# Patient Record
Sex: Male | Born: 1965 | Race: White | Hispanic: Refuse to answer | Marital: Married | Smoking: Former smoker
Health system: Southern US, Community
[De-identification: ages and names within clinical notes are randomized; demographics above are authoritative.]

## PROBLEM LIST (undated history)

## (undated) DIAGNOSIS — F419 Anxiety disorder, unspecified: Secondary | ICD-10-CM

## (undated) DIAGNOSIS — M1990 Unspecified osteoarthritis, unspecified site: Secondary | ICD-10-CM

## (undated) DIAGNOSIS — E785 Hyperlipidemia, unspecified: Secondary | ICD-10-CM

## (undated) DIAGNOSIS — G709 Myoneural disorder, unspecified: Secondary | ICD-10-CM

## (undated) DIAGNOSIS — J45909 Unspecified asthma, uncomplicated: Secondary | ICD-10-CM

## (undated) DIAGNOSIS — M5030 Other cervical disc degeneration, unspecified cervical region: Secondary | ICD-10-CM

## (undated) DIAGNOSIS — F909 Attention-deficit hyperactivity disorder, unspecified type: Secondary | ICD-10-CM

## (undated) DIAGNOSIS — T7840XA Allergy, unspecified, initial encounter: Secondary | ICD-10-CM

## (undated) DIAGNOSIS — K219 Gastro-esophageal reflux disease without esophagitis: Secondary | ICD-10-CM

## (undated) DIAGNOSIS — E039 Hypothyroidism, unspecified: Secondary | ICD-10-CM

## (undated) DIAGNOSIS — G8929 Other chronic pain: Secondary | ICD-10-CM

## (undated) DIAGNOSIS — I10 Essential (primary) hypertension: Secondary | ICD-10-CM

## (undated) HISTORY — DX: Hyperlipidemia, unspecified: E78.5

## (undated) HISTORY — DX: Essential (primary) hypertension: I10

## (undated) HISTORY — DX: Attention-deficit hyperactivity disorder, unspecified type: F90.9

## (undated) HISTORY — DX: Hypothyroidism, unspecified: E03.9

## (undated) HISTORY — PX: SPINE SURGERY: SHX786

## (undated) HISTORY — PX: TONSILLECTOMY: SUR1361

## (undated) HISTORY — DX: Myoneural disorder, unspecified: G70.9

## (undated) HISTORY — PX: BREAST SURGERY: SHX581

## (undated) HISTORY — DX: Unspecified asthma, uncomplicated: J45.909

## (undated) HISTORY — DX: Unspecified osteoarthritis, unspecified site: M19.90

## (undated) HISTORY — DX: Gastro-esophageal reflux disease without esophagitis: K21.9

## (undated) HISTORY — DX: Anxiety disorder, unspecified: F41.9

## (undated) HISTORY — DX: Other cervical disc degeneration, unspecified cervical region: M50.30

## (undated) HISTORY — DX: Other chronic pain: G89.29

## (undated) HISTORY — DX: Allergy, unspecified, initial encounter: T78.40XA

---

## 1998-03-17 HISTORY — PX: SHOULDER SURGERY: SHX246

## 2002-05-12 ENCOUNTER — Encounter: Admission: RE | Admit: 2002-05-12 | Discharge: 2002-06-13 | Payer: Self-pay | Admitting: Family Medicine

## 2003-09-15 ENCOUNTER — Encounter: Admission: RE | Admit: 2003-09-15 | Discharge: 2003-09-15 | Payer: Self-pay | Admitting: Family Medicine

## 2003-09-27 ENCOUNTER — Encounter: Admission: RE | Admit: 2003-09-27 | Discharge: 2003-09-27 | Payer: Self-pay | Admitting: Family Medicine

## 2004-09-09 ENCOUNTER — Ambulatory Visit: Payer: Self-pay | Admitting: Family Medicine

## 2004-09-16 ENCOUNTER — Ambulatory Visit: Payer: Self-pay | Admitting: Family Medicine

## 2005-08-12 ENCOUNTER — Ambulatory Visit: Payer: Self-pay | Admitting: Family Medicine

## 2005-08-19 ENCOUNTER — Ambulatory Visit: Payer: Self-pay | Admitting: Family Medicine

## 2006-03-17 HISTORY — PX: SHOULDER SURGERY: SHX246

## 2006-08-12 ENCOUNTER — Ambulatory Visit: Payer: Self-pay | Admitting: Family Medicine

## 2006-08-12 LAB — CONVERTED CEMR LAB
AST: 26 units/L (ref 0–37)
Bilirubin, Direct: 0.1 mg/dL (ref 0.0–0.3)
Eosinophils Absolute: 0.1 10*3/uL (ref 0.0–0.6)
Eosinophils Relative: 1.5 % (ref 0.0–5.0)
GFR calc Af Amer: 96 mL/min
GFR calc non Af Amer: 80 mL/min
Glucose, Bld: 91 mg/dL (ref 70–99)
HCT: 42.2 % (ref 39.0–52.0)
Hemoglobin: 14.7 g/dL (ref 13.0–17.0)
Lymphocytes Relative: 34 % (ref 12.0–46.0)
MCV: 93.9 fL (ref 78.0–100.0)
Neutro Abs: 2.1 10*3/uL (ref 1.4–7.7)
Neutrophils Relative %: 54.7 % (ref 43.0–77.0)
Sodium: 139 meq/L (ref 135–145)
TSH: 4.02 microintl units/mL (ref 0.35–5.50)
Total Protein: 6.9 g/dL (ref 6.0–8.3)
WBC: 4 10*3/uL — ABNORMAL LOW (ref 4.5–10.5)

## 2006-08-18 ENCOUNTER — Ambulatory Visit: Payer: Self-pay | Admitting: Family Medicine

## 2007-01-08 ENCOUNTER — Telehealth: Payer: Self-pay | Admitting: Family Medicine

## 2007-01-12 ENCOUNTER — Telehealth: Payer: Self-pay | Admitting: Family Medicine

## 2007-01-15 ENCOUNTER — Ambulatory Visit: Payer: Self-pay | Admitting: Family Medicine

## 2007-07-16 HISTORY — PX: CERVICAL DISC SURGERY: SHX588

## 2007-07-23 ENCOUNTER — Ambulatory Visit: Payer: Self-pay | Admitting: Family Medicine

## 2007-07-23 DIAGNOSIS — M25519 Pain in unspecified shoulder: Secondary | ICD-10-CM

## 2007-07-23 DIAGNOSIS — E785 Hyperlipidemia, unspecified: Secondary | ICD-10-CM | POA: Insufficient documentation

## 2007-08-05 ENCOUNTER — Observation Stay (HOSPITAL_COMMUNITY): Admission: RE | Admit: 2007-08-05 | Discharge: 2007-08-06 | Payer: Self-pay | Admitting: Neurological Surgery

## 2007-11-19 ENCOUNTER — Ambulatory Visit: Payer: Self-pay | Admitting: Family Medicine

## 2007-11-19 LAB — CONVERTED CEMR LAB
ALT: 31 units/L (ref 0–53)
Albumin: 4.5 g/dL (ref 3.5–5.2)
BUN: 16 mg/dL (ref 6–23)
Basophils Relative: 0.5 % (ref 0.0–3.0)
Bilirubin Urine: NEGATIVE
CO2: 31 meq/L (ref 19–32)
Calcium: 9.6 mg/dL (ref 8.4–10.5)
Creatinine, Ser: 0.9 mg/dL (ref 0.4–1.5)
Eosinophils Relative: 2 % (ref 0.0–5.0)
GFR calc Af Amer: 121 mL/min
Glucose, Bld: 94 mg/dL (ref 70–99)
HCT: 41.9 % (ref 39.0–52.0)
Hemoglobin: 14.6 g/dL (ref 13.0–17.0)
Ketones, urine, test strip: NEGATIVE
Lymphocytes Relative: 35.1 % (ref 12.0–46.0)
Monocytes Absolute: 0.4 10*3/uL (ref 0.1–1.0)
Monocytes Relative: 9.7 % (ref 3.0–12.0)
Neutro Abs: 2.5 10*3/uL (ref 1.4–7.7)
Nitrite: NEGATIVE
RBC: 4.36 M/uL (ref 4.22–5.81)
RDW: 12.1 % (ref 11.5–14.6)
Sodium: 142 meq/L (ref 135–145)
Specific Gravity, Urine: 1.015
Total CHOL/HDL Ratio: 4.6
Total Protein: 7.3 g/dL (ref 6.0–8.3)
Triglycerides: 156 mg/dL — ABNORMAL HIGH (ref 0–149)

## 2007-12-03 ENCOUNTER — Ambulatory Visit: Payer: Self-pay | Admitting: Family Medicine

## 2008-04-06 ENCOUNTER — Telehealth: Payer: Self-pay | Admitting: Family Medicine

## 2008-04-19 ENCOUNTER — Telehealth: Payer: Self-pay | Admitting: *Deleted

## 2008-04-26 ENCOUNTER — Telehealth: Payer: Self-pay | Admitting: *Deleted

## 2008-09-22 ENCOUNTER — Ambulatory Visit: Payer: Self-pay | Admitting: Family Medicine

## 2008-09-22 LAB — CONVERTED CEMR LAB
ALT: 23 units/L (ref 0–53)
AST: 28 units/L (ref 0–37)
Albumin: 4.5 g/dL (ref 3.5–5.2)
BUN: 14 mg/dL (ref 6–23)
Chloride: 104 meq/L (ref 96–112)
Cholesterol: 176 mg/dL (ref 0–200)
Eosinophils Relative: 1.6 % (ref 0.0–5.0)
GFR calc non Af Amer: 87.6 mL/min (ref >60)
Glucose, Bld: 90 mg/dL (ref 70–99)
Glucose, Urine, Semiquant: NEGATIVE
HCT: 42.1 % (ref 39.0–52.0)
Hemoglobin: 14.9 g/dL (ref 13.0–17.0)
LDL Cholesterol: 115 mg/dL — ABNORMAL HIGH (ref 0–99)
Lymphs Abs: 2.6 10*3/uL (ref 0.7–4.0)
MCV: 94.7 fL (ref 78.0–100.0)
Monocytes Absolute: 0.5 10*3/uL (ref 0.1–1.0)
Monocytes Relative: 10.9 % (ref 3.0–12.0)
Neutro Abs: 1.8 10*3/uL (ref 1.4–7.7)
Platelets: 159 10*3/uL (ref 150.0–400.0)
Potassium: 3.9 meq/L (ref 3.5–5.1)
Protein, U semiquant: NEGATIVE
Sodium: 142 meq/L (ref 135–145)
TSH: 4 microintl units/mL (ref 0.35–5.50)
Total Protein: 7.6 g/dL (ref 6.0–8.3)
VLDL: 20.4 mg/dL (ref 0.0–40.0)
WBC Urine, dipstick: NEGATIVE
WBC: 5 10*3/uL (ref 4.5–10.5)
pH: 5.5

## 2008-09-29 ENCOUNTER — Ambulatory Visit: Payer: Self-pay | Admitting: Family Medicine

## 2009-03-01 ENCOUNTER — Encounter: Admission: RE | Admit: 2009-03-01 | Discharge: 2009-03-01 | Payer: Self-pay | Admitting: Neurological Surgery

## 2009-05-03 ENCOUNTER — Telehealth: Payer: Self-pay | Admitting: Family Medicine

## 2009-05-10 ENCOUNTER — Encounter: Payer: Self-pay | Admitting: Family Medicine

## 2009-07-24 ENCOUNTER — Ambulatory Visit: Payer: Self-pay | Admitting: Family Medicine

## 2009-07-24 DIAGNOSIS — J029 Acute pharyngitis, unspecified: Secondary | ICD-10-CM

## 2009-07-24 DIAGNOSIS — J309 Allergic rhinitis, unspecified: Secondary | ICD-10-CM | POA: Insufficient documentation

## 2009-09-10 ENCOUNTER — Ambulatory Visit: Payer: Self-pay | Admitting: Family Medicine

## 2009-09-10 LAB — CONVERTED CEMR LAB
ALT: 31 units/L (ref 0–53)
AST: 29 units/L (ref 0–37)
Albumin: 4.9 g/dL (ref 3.5–5.2)
Basophils Absolute: 0 10*3/uL (ref 0.0–0.1)
CO2: 32 meq/L (ref 19–32)
Chloride: 105 meq/L (ref 96–112)
Eosinophils Relative: 1.9 % (ref 0.0–5.0)
GFR calc non Af Amer: 92.5 mL/min (ref >60)
Glucose, Bld: 94 mg/dL (ref 70–99)
Glucose, Urine, Semiquant: NEGATIVE
HCT: 43.5 % (ref 39.0–52.0)
HDL: 46.3 mg/dL (ref >39.00)
Hemoglobin: 15.1 g/dL (ref 13.0–17.0)
Lymphs Abs: 1.9 10*3/uL (ref 0.7–4.0)
MCV: 96.1 fL (ref 78.0–100.0)
Monocytes Absolute: 0.6 10*3/uL (ref 0.1–1.0)
Monocytes Relative: 11.1 % (ref 3.0–12.0)
Neutro Abs: 3 10*3/uL (ref 1.4–7.7)
Nitrite: NEGATIVE
Potassium: 5.2 meq/L — ABNORMAL HIGH (ref 3.5–5.1)
RDW: 13.1 % (ref 11.5–14.6)
Sodium: 143 meq/L (ref 135–145)
Specific Gravity, Urine: >=1.03
TSH: 6.29 microintl units/mL — ABNORMAL HIGH (ref 0.35–5.50)
WBC Urine, dipstick: NEGATIVE
pH: 6

## 2009-09-28 ENCOUNTER — Ambulatory Visit: Payer: Self-pay | Admitting: Family Medicine

## 2009-11-08 ENCOUNTER — Telehealth: Payer: Self-pay | Admitting: Family Medicine

## 2009-12-28 ENCOUNTER — Encounter: Payer: Self-pay | Admitting: Family Medicine

## 2010-02-14 ENCOUNTER — Telehealth: Payer: Self-pay | Admitting: Family Medicine

## 2010-02-15 ENCOUNTER — Telehealth: Payer: Self-pay | Admitting: Internal Medicine

## 2010-02-28 ENCOUNTER — Ambulatory Visit (HOSPITAL_COMMUNITY)
Admission: RE | Admit: 2010-02-28 | Discharge: 2010-02-28 | Payer: Self-pay | Source: Home / Self Care | Attending: Neurological Surgery | Admitting: Neurological Surgery

## 2010-04-16 NOTE — Miscellaneous (Signed)
  Clinical Lists Changes  Medications: Added new medication of FLONASE 50 MCG/ACT SUSP (FLUTICASONE PROPIONATE) as directed - Signed Rx of FLONASE 50 MCG/ACT SUSP (FLUTICASONE PROPIONATE) as directed;  #1 x 4;  Signed;  Entered by: Lynann Beaver CMA;  Authorized by: Roderick Pee MD;  Method used: [facility]  [address] [phone]*, [address], [place], [place], [place]  [id number], Ph: [phone] or [phone], Fax: [phone]    Prescriptions: FLONASE 50 MCG/ACT SUSP (FLUTICASONE PROPIONATE) as directed  #1 x 4   Entered by:   Lynann Beaver CMA   Authorized by:   Roderick Pee MD   Signed by:   Lynann Beaver CMA on 12/28/2009   Method used:   Electronically to        [facility]  [address] [phone]* (retail)       [address]       [place], [place]  [address]       Ph: [phone] or [phone]       Fax: [phone]   RxID:   [id number]

## 2010-04-16 NOTE — Progress Notes (Signed)
Summary: rx   Phone Note Call from Patient   Summary of Call: would like a new rx for concerta 36 Initial call taken by: Kern Reap CMA Duncan Dull),  May 03, 2009 12:40 PM    New/Updated Medications: CONCERTA 36 MG CR-TABS (METHYLPHENIDATE HCL) take one tab once daily Prescriptions: CONCERTA 36 MG CR-TABS (METHYLPHENIDATE HCL) take one tab once daily  #90 x 0   Entered by:   Kern Reap CMA (AAMA)   Authorized by:   Roderick Pee MD   Signed by:   Kern Reap CMA (AAMA) on 05/03/2009   Method used:   Print then Give to Patient   RxID:   [id number]

## 2010-04-16 NOTE — Progress Notes (Signed)
Summary: refill  Phone Note Refill Request Message from:  Fax from Pharmacy on November 08, 2009 11:49 AM  Refills Requested: Medication #1:  CONCERTA 36 MG CR-TABS take one tab once daily. Initial call taken by: Kern Reap CMA Duncan Dull),  November 08, 2009 11:50 AM  Follow-up for Phone Call        left message on machine rx ready to pick up Follow-up by: Kern Reap CMA Duncan Dull),  November 08, 2009 12:12 PM    Prescriptions: CONCERTA 36 MG CR-TABS (METHYLPHENIDATE HCL) take one tab once daily  #90 x 0   Entered by:   Kern Reap CMA (AAMA)   Authorized by:   Roderick Pee MD   Signed by:   Kern Reap CMA (AAMA) on 11/08/2009   Method used:   Print then Give to Patient   RxID:   [id number]

## 2010-04-16 NOTE — Medication Information (Signed)
Summary: Coverage Approval for Concerta  Coverage Approval for Concerta   Imported By: Maryln Gottron 05/15/2009 10:11:22  _____________________________________________________________________  External Attachment:    Type:   Image     Comment:   External Document

## 2010-04-16 NOTE — Miscellaneous (Signed)
Summary: Controlled Substances Contract  Controlled Substances Contract   Imported By: Maryln Gottron 10/02/2009 10:32:36  _____________________________________________________________________  External Attachment:    Type:   Image     Comment:   External Document

## 2010-04-16 NOTE — Progress Notes (Signed)
Summary: Chg to Generic Concerta question  Phone Note From Pharmacy   [facility]  [address] [phone]* Call For: K  Summary of Call: [phone] [facility] called on behalf of pt requesting approval for Concerta 36 to be changed to generic.  The Rx they have says DAW. Initial call taken by: Sid Falcon LPN,  February 15, 2010 1:39 PM  Follow-up for Phone Call        ok to change to generic Follow-up by: Gordy Savers  MD,  February 15, 2010 4:24 PM  Additional Follow-up for Phone Call Additional follow up Details #1::        Pharmacist informed Additional Follow-up by: Sid Falcon LPN,  February 15, 2010 4:42 PM

## 2010-04-16 NOTE — Assessment & Plan Note (Signed)
Summary: CPX/CJR   Vital Signs:  Patient profile:   45 year old male Height:      98.6 inches Weight:      227 pounds Temp:     98.6 degrees F oral BP sitting:   120 / 84  (left arm) Cuff size:   regular  Vitals Entered By: Kern Reap CMA Duncan Dull) (September 28, 2009 2:10 PM) CC: cpx Is Patient Diabetic? No   CC:  cpx.  History of Present Illness: Chase Burke is a 45 year old, married male, who, Ph.D. of Patent attorney, who comes in today for annual physical examination  His underlying rosacea for which he takes doxycycline 1 mg b.i.d.  He takes Lipitor 20 mg nightly along with an aspirin tablet for hyperlipidemia, HDL 46, LDL 136 continue above medicines.  He takes a half of a Flexeril and Vicodin 7.5 mg nightly for chronic neck pain.  He had surgery in 09.  Has had epidural steroid injections.  PT etc., etc., and Dr. Danielle Dess feels he can have chronic pain.  Therefore explained to him.  The pain contract.  He is agreeable.  He has ADD.  He takes Concerta 36 mg daily the medication is working well.  Is able to focus and concentrate.  He gets routine eye care doctor.  Tetanus 2004 seasonal flu 2010  Allergies (verified): No Known Drug Allergies  Past History:  Past medical, surgical, family and social histories (including risk factors) reviewed, and no changes noted (except as noted below).  Past Medical History: Reviewed history from 12/03/2007 and no changes required. ADD High Cholesterol Hyperlipidemia cervical disk surgery, May 2009 Dr. Danielle Dess  Past Surgical History: Reviewed history from 12/09/2006 and no changes required. R Shoulder Surgery Breast Reduction Tonsillectomy  Family History: Reviewed history from 12/09/2006 and no changes required. Family History of Arthritis Family History High cholesterol Family History Hypertension Family History Kidney disease  Social History: Reviewed history from 09/29/2008 and no changes required. Occupation: phd Former  Smoker Alcohol use-yes Married  Review of Systems      See HPI  Physical Exam  General:  Well-developed,well-nourished,in no acute distress; alert,appropriate and cooperative throughout examination Head:  Normocephalic and atraumatic without obvious abnormalities. No apparent alopecia or balding. Eyes:  No corneal or conjunctival inflammation noted. EOMI. Perrla. Funduscopic exam benign, without hemorrhages, exudates or papilledema. Vision grossly normal. Ears:  External ear exam shows no significant lesions or deformities.  Otoscopic examination reveals clear canals, tympanic membranes are intact bilaterally without bulging, retraction, inflammation or discharge. Hearing is grossly normal bilaterally. Nose:  External nasal examination shows no deformity or inflammation. Nasal mucosa are pink and moist without lesions or exudates. Mouth:  Oral mucosa and oropharynx without lesions or exudates.  Teeth in good repair. Neck:  No deformities, masses, or tenderness noted. Chest Wall:  No deformities, masses, tenderness or gynecomastia noted. Breasts:  No masses or gynecomastia noted Lungs:  Normal respiratory effort, chest expands symmetrically. Lungs are clear to auscultation, no crackles or wheezes. Heart:  Normal rate and regular rhythm. S1 and S2 normal without gallop, murmur, click, rub or other extra sounds. Abdomen:  Bowel sounds positive,abdomen soft and non-tender without masses, organomegaly or hernias noted. Rectal:  No external abnormalities noted. Normal sphincter tone. No rectal masses or tenderness. Genitalia:  Testes bilaterally descended without nodularity, tenderness or masses. No scrotal masses or lesions. No penis lesions or urethral discharge. Prostate:  Prostate gland firm and smooth, no enlargement, nodularity, tenderness, mass, asymmetry or induration. Msk:  No  deformity or scoliosis noted of thoracic or lumbar spine.   Pulses:  R and L carotid,radial,femoral,dorsalis  pedis and posterior tibial pulses are full and equal bilaterally Extremities:  No clubbing, cyanosis, edema, or deformity noted with normal full range of motion of all joints.   Neurologic:  No cranial nerve deficits noted. Station and gait are normal. Plantar reflexes are down-going bilaterally. DTRs are symmetrical throughout. Sensory, motor and coordinative functions appear intact. Skin:  Intact without suspicious lesions or rashes Cervical Nodes:  No lymphadenopathy noted Axillary Nodes:  No palpable lymphadenopathy Inguinal Nodes:  No significant adenopathy Psych:  Cognition and judgment appear intact. Alert and cooperative with normal attention span and concentration. No apparent delusions, illusions, hallucinations   Impression & Recommendations:  Problem # 1:  Preventive Health Care (ICD-V70.0) Assessment Unchanged  Problem # 2:  HYPERLIPIDEMIA (ICD-272.4) Assessment: Improved  His updated medication list for this problem includes:    Lipitor 20 Mg Tabs (Atorvastatin calcium) .Marland Kitchen... 1 tab @ bedtime  Orders: Prescription Created Electronically [phone]) EKG w/ Interpretation (93000)  Problem # 3:  ADD (ICD-314.00) Assessment: Improved  Orders: Prescription Created Electronically [phone])  Problem # 4:  NECK PAIN, CHRONIC (ICD-723.1) Assessment: Unchanged  His updated medication list for this problem includes:    Bayer Aspirin 325 Mg Tabs (Aspirin)    Flexeril 5 Mg Tabs (Cyclobenzaprine hcl) .Marland Kitchen... 1/2 at bedtime    Vicodin Es 7.5-750 Mg Tabs (Hydrocodone-acetaminophen) .Marland Kitchen... 1/2 at bedtime  Orders: Prescription Created Electronically [phone])  Complete Medication List: 1)  Doxycycline Hyclate 100 Mg Tabs (Doxycycline hyclate) .... Take 1 tablet by mouth twice a day 2)  Lipitor 20 Mg Tabs (Atorvastatin calcium) .Marland Kitchen.. 1 tab @ bedtime 3)  Bayer Aspirin 325 Mg Tabs (Aspirin) 4)  Flexeril 5 Mg Tabs (Cyclobenzaprine hcl) .... 1/2 at bedtime 5)  Vicodin Es 7.5-750 Mg Tabs  (Hydrocodone-acetaminophen) .... 1/2 at bedtime 6)  Concerta 36 Mg Cr-tabs (Methylphenidate hcl) .... Take one tab once daily  Patient Instructions: 1)  continue your current medications.  We will refill your concern Flexeril and Vicodin every 90 days 2)  Please schedule a follow-up appointment in 1 year. Prescriptions: VICODIN ES 7.5-750 MG TABS (HYDROCODONE-ACETAMINOPHEN) 1/2 at bedtime  #45 x 0   Entered and Authorized by:   Roderick Pee MD   Signed by:   Roderick Pee MD on 09/28/2009   Method used:   Print then Give to Patient   RxID:   [id number] FLEXERIL 5 MG TABS (CYCLOBENZAPRINE HCL) 1/2 at bedtime  #45 x 0   Entered and Authorized by:   Roderick Pee MD   Signed by:   Roderick Pee MD on 09/28/2009   Method used:   Print then Give to Patient   RxID:   [id number] LIPITOR 20 MG TABS (ATORVASTATIN CALCIUM) 1 tab @ bedtime  #100 x 3   Entered and Authorized by:   Roderick Pee MD   Signed by:   Roderick Pee MD on 09/28/2009   Method used:   [facility] to        [facility]  [address] [phone]* (retail)       [address]       [place], [place]  [address]       Ph: [phone] or [phone]       Fax: [phone]   RxID:   [id number] DOXYCYCLINE HYCLATE 100 MG TABS (DOXYCYCLINE HYCLATE) Take 1  tablet by mouth twice a day  #200 x 4   Entered and Authorized by:   Roderick Pee MD   Signed by:   Roderick Pee MD on 09/28/2009   Method used:   Electronically to        [facility] [address] [phone]* (retail)       [address]       [place], [place]  [address]       Ph: [phone] or [phone]       Fax: [phone]   RxID:   [id number]    Immunization History:  Influenza Immunization History:    Influenza:  historical (12/15/2008)

## 2010-04-16 NOTE — Assessment & Plan Note (Signed)
Summary: sore throat/cold symptoms/cjr   Vital Signs:  Patient profile:   45 year old male Height:      63.75 inches Weight:      232 pounds BMI:     40.28 Temp:     98.5 degrees F oral BP sitting:   120 / 90  (left arm)  Vitals Entered By: Kern Reap CMA Duncan Dull) (Jul 24, 2009 12:20 PM) CC: sore throat, tired Is Patient Diabetic? No Pain Assessment Patient in pain? no        CC:  sore throat and tired.  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  Three weeks ago he picked up a coxsackie from his son.  He had fever, sore throat, and blisters, which has since resolved however, he still feels tired, and no energy.  Also has had congestion, postnasal drip since spring started.  He has a history of allergic rhinitis, but only childhood asthma.  He is also struggling with his neck.  His surgery by Dr. Danielle Dess, and he still having pain.  He started physical therapy, but nothing has seemed to help. he  Is considering asking Dr. Danielle Dess for a second opinion at a [facility]  Allergies: No Known Drug Allergies  Past History:  Past medical, surgical, family and social histories (including risk factors) reviewed for relevance to current acute and chronic problems.  Past Medical History: Reviewed history from 12/03/2007 and no changes required. ADD High Cholesterol Hyperlipidemia cervical disk surgery, May 2009 Dr. Danielle Dess  Past Surgical History: Reviewed history from 12/09/2006 and no changes required. R Shoulder Surgery Breast Reduction Tonsillectomy  Family History: Reviewed history from 12/09/2006 and no changes required. Family History of Arthritis Family History High cholesterol Family History Hypertension Family History Kidney disease  Social History: Reviewed history from 09/29/2008 and no changes required. Occupation:  phd Former Smoker Alcohol use-yes Married  Review of Systems      See HPI  Physical Exam  General:  Well-developed,well-nourished,in no acute distress; alert,appropriate and cooperative throughout examination Head:  Normocephalic and atraumatic without obvious abnormalities. No apparent alopecia or balding. Eyes:  No corneal or conjunctival inflammation noted. EOMI. Perrla. Funduscopic exam benign, without hemorrhages, exudates or papilledema. Vision grossly normal. Ears:  External ear exam shows no significant lesions or deformities.  Otoscopic examination reveals clear canals, tympanic membranes are intact bilaterally without bulging, retraction, inflammation or discharge. Hearing is grossly normal bilaterally. Nose:  External nasal examination shows no deformity or inflammation. Nasal mucosa are pink and moist without lesions or exudates. Mouth:  Oral mucosa and oropharynx without lesions or exudates.  Teeth in good repair. Neck:  No deformities, masses, or tenderness noted. Chest Wall:  No deformities, masses, tenderness or gynecomastia noted. Lungs:  Normal respiratory effort, chest expands symmetrically. Lungs are clear to auscultation, no crackles or wheezes.   Problems:  Medical Problems Added: 1)  Dx of Rhinitis  (ICD-477.9) 2)  Dx of Sore Throat  (ICD-462)  Impression & Recommendations:  Problem # 1:  RHINITIS (ICD-477.9) Assessment New  Complete Medication List: 1)  Doxycycline Hyclate 100 Mg Tabs (Doxycycline hyclate) .... Take 1 tablet by mouth twice a day 2)  Lipitor 20 Mg Tabs (Atorvastatin calcium) 3)  Bayer Aspirin 325 Mg Tabs (Aspirin) 4)  Flexeril 5 Mg Tabs (Cyclobenzaprine hcl) .Marland Kitchen.. 1 tab @ bedtime 5)  Vicodin Es 7.5-750 Mg Tabs (Hydrocodone-acetaminophen) .Marland KitchenMarland KitchenMarland Kitchen 1  tab @ bedtime 6)  Concerta 36 Mg Cr-tabs (Methylphenidate hcl) .... Take one tab once daily  Other Orders: Rapid Strep ([id number])  Patient Instructions: 1)  take 10 mg of plain Zyrtec at bedtime  if you don't see much improvement in a couple weeks.  Call we will e-mail and some Flonase nasal spray

## 2010-04-16 NOTE — Progress Notes (Signed)
Summary: refll  Phone Note Refill Request Message from:  Fax from Pharmacy on February 14, 2010 2:03 PM  Refills Requested: Medication #1:  CONCERTA 36 MG CR-TABS take one tab once daily Initial call taken by: Kern Reap CMA Duncan Dull),  February 14, 2010 2:03 PM  Follow-up for Phone Call        rx ready for pick up. left message on machine  Follow-up by: Kern Reap CMA Duncan Dull),  February 14, 2010 2:08 PM    Prescriptions: CONCERTA 36 MG CR-TABS (METHYLPHENIDATE HCL) take one tab once daily  #90 x 0   Entered by:   Kern Reap CMA (AAMA)   Authorized by:   Gordy Savers  MD   Signed by:   Kern Reap CMA (AAMA) on 02/14/2010   Method used:   Print then Give to Patient   RxID:   [id number]

## 2010-04-26 ENCOUNTER — Other Ambulatory Visit: Payer: Self-pay | Admitting: Family Medicine

## 2010-05-01 ENCOUNTER — Telehealth: Payer: Self-pay | Admitting: *Deleted

## 2010-05-01 NOTE — Telephone Encounter (Signed)
patient  Is requesting a refill of hydrocodone.  Is this okay to fill?

## 2010-05-02 NOTE — Telephone Encounter (Signed)
Chronic neck pain.

## 2010-05-02 NOTE — Telephone Encounter (Signed)
Pain medication for what reason?????????

## 2010-05-03 NOTE — Telephone Encounter (Signed)
Vicodin ES, dispense 50 tablets directions one half tab nightly refills x 1.  Flexeril 5 mg, dispense 50 tabs directions one half tab nightly refills x 1

## 2010-05-06 NOTE — Telephone Encounter (Signed)
rx called in

## 2010-05-27 ENCOUNTER — Other Ambulatory Visit: Payer: Self-pay | Admitting: Family Medicine

## 2010-05-27 MED ORDER — METHYLPHENIDATE HCL ER (OSM) 36 MG PO TBCR: 36.0000 mg | EXTENDED_RELEASE_TABLET | Freq: Every day | ORAL | Status: DC

## 2010-05-27 NOTE — Telephone Encounter (Signed)
rx ready for pick up and patient is aware  

## 2010-07-30 NOTE — Op Note (Signed)
NAMEGARNELL, PHENIX NO.:  [id number]   MEDICAL RECORD NO.:  [id number]          PATIENT TYPE:  OBV   LOCATION:  3534                         FACILITY:  [facility]   PHYSICIAN:  Stefani Dama, M.D.  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   PREOPERATIVE DIAGNOSES:  Cervical spondylosis plus herniated nucleus  pulposus at C4-C5 and C5-C6 with cervical radiculopathy.   PROCEDURES:  Anterior cervical decompression at C4-C5 with arthroplasty  at C4-C5, anterior cervical decompression at C5-C6 with arthrodesis at  C5-C6 with anterior cervical plate fixation and structural allograft.   SURGEON:  Dr. Stefani Dama, M.D.   FIRST ASSISTANT:  Dr. Hewitt Shorts, M.D.   ANESTHESIA:  General endotracheal.   INDICATIONS:  Mr. Riyan Haile is a 45 year old individual who has been  experiencing significant pain in his neck, shoulder, and left arm.  He  has evolved some weakness over a period of time and was noted that he  had severe spondylitic ridging with an acute on chronic disk herniation  and spondylitic changes at C4-C5 and C5-C6.  He was advised regarding  surgical decompression and arthrodesis.  In his case, we wish to do an  arthroplasty to preserve motion at the C4-C5 level, and this is being  planned currently.   PROCEDURE:  The patient was brought to the operating room and placed on  table in supine position.  After padding and supporting the neck in  appropriate position, the frontal neck was prepped with alcohol and  DuraPrep and draped in sterile fashion.  A transverse incision was made  in the midportion of the neck after localizing the area above C4-C5.  Dissection was carried down to the platysma, and the plane between the  sternocleidomastoid and the strap muscle was dissected bluntly until the  first prevertebral space was reached.  This was identified with  fluoroscopy as being C4-C5 positively.  The dissection was taken off  carefully to expose and retract slightly the longus coli musculature and  then with the anterior vertebral bodies of C4 and C5 being identified.  Distraction screws were placed in C4 at the superior most margin of the  disk space and the inferior most margin of disk space at C5.  The disk  space was then opened with a #15 blade.  Combination of Kerrison  rongeurs was used to evacuate significant quantity of moderately  degenerated disk material.  The area was decompressed laterally to the  left and to the right and slight bit of distraction was placed on C4 and  C5.  Once this was accomplished, the posterior margin of the disk space  was cleared and significant osteophytosis of the left side was cleared  with high-speed bur and a 2.3-mm dissecting tool.  Care was taken to  make sure that the lateral margins and the lateral passage for the C5  nerve root on the left side was particularly well decompressed.  Once  this was performed, then a series of trial spaces were placed to assess  the appropriateness and size for the arthroplasty.  It was decided to  place a 5-mm tall medium-sized deep spacer.  The deep spacer filled the  interspace well from the ventral to the most dorsal margin.  Then, the  cutting tool was attached and placed over the trial; and by checking AP  and lateral fluoroscopy, the cutting tool was set and cuts were made for  the keel in C4 and C5 respectively.  Once this was performed, the keel  chisel was placed through the slots that were cut to verify that the  cuts were complete.  Then, the cutting tool and the guidewire was  removed, and the appropriate size spacer was chosen and placed into the  interspace and then countersunk under direct visualization with  cross-  table lateral fluoro.  This are tamped into final resting position.  The  applier was removed.  The area was inspected for hemostasis.  Bone wax  was applied to the ventral edges of the arthroplasty, bone edges; and  once this was noted to be hemostatic, attention was turned to C5-C6.  Here, standard diskectomy was performed using 15 blade to open anterior  longitudinal ligament.  The ventral osteophytes were resected using a 2-  3 mm rongeur and Kerrison punch.  The disk space was then evacuated with  a significant quantity of moderately degenerated desiccated material.  Here, similar process was found on the left side particularly, and a  high-speed bur with 2.3 mm drill was used to dissect off the left side  to clear this area.  Once this was accomplished, hemostasis was achieved  in the lateral gutters.  Then, 8-mm standard size Alphatec graft was cut  and sized to an appropriate size and position.  This was filled with  demineralized bone matrix and placed into the interspace and countersunk  appropriately.  A 14-mm standard size Alphatec plate was then fixed to  the central aspect of the vertebral bodies with 14-mm screws.  Once this  was secured, final localizing radiograph was obtained in the lateral  projection.  Hemostasis was obtained in the soft tissues, and then the  platysma was closed with 2-0 Vicryl in interrupted fashion, 2-0 Vicryl  was used subcuticularly, and 3-0 Vicryl was used subcuticularly to close  the skin.  The patient tolerated the procedure well and was returned to  recovery room in stable condition.      Stefani Dama, M.D.  Electronically Signed     HJE/MEDQ  D:  08/19/2007  T:  08/20/2007  Job:  [id number]

## 2010-08-06 ENCOUNTER — Other Ambulatory Visit: Payer: Self-pay | Admitting: Family Medicine

## 2010-09-03 ENCOUNTER — Other Ambulatory Visit: Payer: Self-pay | Admitting: *Deleted

## 2010-09-03 MED ORDER — METHYLPHENIDATE HCL ER (OSM) 36 MG PO TBCR: 36.0000 mg | EXTENDED_RELEASE_TABLET | Freq: Every day | ORAL | Status: DC

## 2010-09-30 ENCOUNTER — Other Ambulatory Visit: Payer: Self-pay | Admitting: Family Medicine

## 2010-10-08 ENCOUNTER — Encounter: Payer: Self-pay | Admitting: Family Medicine

## 2010-10-09 ENCOUNTER — Encounter: Payer: Self-pay | Admitting: Family Medicine

## 2010-10-09 ENCOUNTER — Ambulatory Visit (INDEPENDENT_AMBULATORY_CARE_PROVIDER_SITE_OTHER): Payer: BC Managed Care – PPO | Admitting: Family Medicine

## 2010-10-09 DIAGNOSIS — M542 Cervicalgia: Secondary | ICD-10-CM

## 2010-10-09 NOTE — Progress Notes (Signed)
  Subjective:    Patient ID: Chase Burke, male    DOB: 1966-02-07, 45 y.o.   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  He said cervical disk surgery by Dr. Danielle Dess, and subsequently, the pain was not totally relieved.  He underwent other diagnostic studies did have some injections.  However, he  referred himself to [facility] pain clinic.  They plan on doing facet injections next week and we were to prescribe his methadone.  Is currently taking Flexeril and Vicodin at bedtime for pain.  I explained to Dr. Valerie Salts, that I am not comfortable prescribing medications for chronic pain, and I referred him to the chronic pain clinic here in [facility], added by Dr. Vear Clock.  They will be able to prescribe this medication and do the injections   Review of Systems    Negative Objective:   Physical Exam    Well-developed well-nourished man in no acute distress    Assessment & Plan:  Chronic pain referred to the pain clinic

## 2010-10-10 ENCOUNTER — Telehealth: Payer: Self-pay | Admitting: *Deleted

## 2010-10-10 NOTE — Telephone Encounter (Signed)
Pt is currently a pt of Dr. Nelida Meuse at [facility].  He sees Dr. Faylene Million, his pain management doctor at [facility].  He gets facet injections there for pinched nerves in cervical spine and now he has been placed on tablet form of methadone.  They dont want to have to drive to [facility] every month to pick up these scripts and Dr. Tawanna Cooler will not prescribe the methadone, he suggested that pt find another doctor.  Pt is asking if he can switch to you, since you have experience with pallitive care.  I told his wife that, generally, if pt goes to pain management clinic, that all of his pain meds would need to come from that doctor.  Please advise.

## 2010-10-10 NOTE — Telephone Encounter (Signed)
Spoke with patient's wife and advised results, per wife they probably will have to go back to pain management.

## 2010-10-10 NOTE — Telephone Encounter (Signed)
I think this would need to come through the pain clinic.  I will defer to pt and PMD.

## 2010-12-03 ENCOUNTER — Other Ambulatory Visit: Payer: Self-pay | Admitting: *Deleted

## 2010-12-03 MED ORDER — METHYLPHENIDATE HCL ER (OSM) 36 MG PO TBCR: 36.0000 mg | EXTENDED_RELEASE_TABLET | Freq: Every day | ORAL | Status: DC

## 2010-12-03 NOTE — Telephone Encounter (Signed)
rx ready for pick up and patient is aware  

## 2010-12-11 LAB — HEPATIC FUNCTION PANEL
AST: 28
Albumin: 4.7
Total Protein: 7.2

## 2010-12-11 LAB — CBC
Platelets: 190
RDW: 12.6

## 2010-12-11 LAB — BASIC METABOLIC PANEL
BUN: 13
Calcium: 10
GFR calc non Af Amer: >60
Glucose, Bld: 101 — ABNORMAL HIGH

## 2011-02-24 ENCOUNTER — Other Ambulatory Visit: Payer: Self-pay | Admitting: Family Medicine

## 2011-02-24 NOTE — Telephone Encounter (Signed)
Pt need concerta 36 mg for 3 month supply

## 2011-02-25 MED ORDER — METHYLPHENIDATE HCL ER (OSM) 36 MG PO TBCR: 36.0000 mg | EXTENDED_RELEASE_TABLET | Freq: Every day | ORAL | Status: DC

## 2011-02-26 MED ORDER — ACYCLOVIR 400 MG PO TABS: 400.0000 mg | ORAL_TABLET | Freq: Three times a day (TID) | ORAL | Status: DC

## 2011-02-26 MED ORDER — FLUTICASONE PROPIONATE 50 MCG/ACT NA SUSP: 2.0000 | Freq: Every day | NASAL | Status: DC

## 2011-02-26 MED ORDER — ATORVASTATIN CALCIUM 20 MG PO TABS: 20.0000 mg | ORAL_TABLET | Freq: Every day | ORAL | Status: DC

## 2011-02-26 NOTE — Telephone Encounter (Signed)
rx is ready for pick up and patient is aware 

## 2011-05-15 DIAGNOSIS — M47812 Spondylosis without myelopathy or radiculopathy, cervical region: Secondary | ICD-10-CM | POA: Insufficient documentation

## 2011-06-11 ENCOUNTER — Telehealth: Payer: Self-pay | Admitting: Family Medicine

## 2011-06-11 MED ORDER — METHYLPHENIDATE HCL ER (OSM) 36 MG PO TBCR: 36.0000 mg | EXTENDED_RELEASE_TABLET | Freq: Every day | ORAL | Status: DC

## 2011-06-11 NOTE — Telephone Encounter (Signed)
Rx ready for pick up and patient is aware 

## 2011-06-11 NOTE — Telephone Encounter (Signed)
Pt requesting written script on    methylphenidate (CONCERTA) 36 MG CR tablet

## 2011-07-28 DIAGNOSIS — M961 Postlaminectomy syndrome, not elsewhere classified: Secondary | ICD-10-CM | POA: Insufficient documentation

## 2011-09-02 ENCOUNTER — Other Ambulatory Visit (INDEPENDENT_AMBULATORY_CARE_PROVIDER_SITE_OTHER): Payer: BC Managed Care – PPO

## 2011-09-02 DIAGNOSIS — Z0000 Encounter for general adult medical examination without abnormal findings: Secondary | ICD-10-CM

## 2011-09-02 LAB — POCT URINALYSIS DIPSTICK
Glucose, UA: NEGATIVE
Leukocytes, UA: NEGATIVE
Nitrite, UA: NEGATIVE
Spec Grav, UA: 1.02
Urobilinogen, UA: 0.2

## 2011-09-02 LAB — CBC WITH DIFFERENTIAL/PLATELET
Basophils Relative: 0.7 % (ref 0.0–3.0)
Eosinophils Absolute: 0.2 10*3/uL (ref 0.0–0.7)
Hemoglobin: 14.3 g/dL (ref 13.0–17.0)
Lymphocytes Relative: 35.5 % (ref 12.0–46.0)
MCHC: 33.4 g/dL (ref 30.0–36.0)
MCV: 95.3 fl (ref 78.0–100.0)
Monocytes Absolute: 0.5 10*3/uL (ref 0.1–1.0)
Neutro Abs: 3.3 10*3/uL (ref 1.4–7.7)
RBC: 4.49 Mil/uL (ref 4.22–5.81)

## 2011-09-02 LAB — TSH: TSH: 38.5 u[IU]/mL — ABNORMAL HIGH (ref 0.35–5.50)

## 2011-09-02 LAB — LIPID PANEL
Cholesterol: 184 mg/dL (ref 0–200)
LDL Cholesterol: 110 mg/dL — ABNORMAL HIGH (ref 0–99)
Total CHOL/HDL Ratio: 4

## 2011-09-02 LAB — BASIC METABOLIC PANEL
BUN: 14 mg/dL (ref 6–23)
Chloride: 104 mEq/L (ref 96–112)
Creatinine, Ser: 1 mg/dL (ref 0.4–1.5)

## 2011-09-02 LAB — HEPATIC FUNCTION PANEL: Total Bilirubin: 0.4 mg/dL (ref 0.3–1.2)

## 2011-09-09 ENCOUNTER — Ambulatory Visit (INDEPENDENT_AMBULATORY_CARE_PROVIDER_SITE_OTHER): Payer: BC Managed Care – PPO | Admitting: Family Medicine

## 2011-09-09 ENCOUNTER — Encounter: Payer: Self-pay | Admitting: Family Medicine

## 2011-09-09 VITALS — BP 120/86 | Temp 98.3°F | Ht 73.0 in | Wt 236.0 lb

## 2011-09-09 DIAGNOSIS — F988 Other specified behavioral and emotional disorders with onset usually occurring in childhood and adolescence: Secondary | ICD-10-CM

## 2011-09-09 DIAGNOSIS — E039 Hypothyroidism, unspecified: Secondary | ICD-10-CM

## 2011-09-09 DIAGNOSIS — E785 Hyperlipidemia, unspecified: Secondary | ICD-10-CM

## 2011-09-09 DIAGNOSIS — M542 Cervicalgia: Secondary | ICD-10-CM

## 2011-09-09 DIAGNOSIS — J309 Allergic rhinitis, unspecified: Secondary | ICD-10-CM

## 2011-09-09 DIAGNOSIS — B009 Herpesviral infection, unspecified: Secondary | ICD-10-CM

## 2011-09-09 MED ORDER — LEVOTHYROXINE SODIUM 50 MCG PO TABS: 50.0000 ug | ORAL_TABLET | Freq: Every day | ORAL | Status: DC

## 2011-09-09 MED ORDER — METHYLPHENIDATE HCL ER (OSM) 36 MG PO TBCR: 36.0000 mg | EXTENDED_RELEASE_TABLET | Freq: Every day | ORAL | Status: DC

## 2011-09-09 MED ORDER — ACYCLOVIR 400 MG PO TABS: 400.0000 mg | ORAL_TABLET | Freq: Three times a day (TID) | ORAL | Status: DC

## 2011-09-09 MED ORDER — ATORVASTATIN CALCIUM 20 MG PO TABS: 20.0000 mg | ORAL_TABLET | Freq: Every day | ORAL | Status: DC

## 2011-09-09 MED ORDER — FLUTICASONE PROPIONATE 50 MCG/ACT NA SUSP: 2.0000 | Freq: Every day | NASAL | Status: DC

## 2011-09-09 NOTE — Patient Instructions (Signed)
Continue your current medications  Call 2 weeks from being out of your concern or for refills  Return in one year or for general physical exam

## 2011-09-09 NOTE — Progress Notes (Signed)
  Subjective:    Patient ID: Chase Burke, male    DOB: [birth year], 46 y.o.   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 He wants to discuss this shingles vaccine I asked him to call his insurance company to see if it's a covered benefit at his age.   Review of Systems  Constitutional: Negative.   HENT: Negative.   Eyes: Negative.   Respiratory: Negative.   Cardiovascular: Negative.   Gastrointestinal: Negative.   Genitourinary: Negative.   Musculoskeletal: Negative.   Skin: Negative.   Neurological: Negative.   Hematological: Negative.   Psychiatric/Behavioral: Negative.        Objective:   Physical Exam  Constitutional: He is oriented to person, place, and time. He appears well-developed and well-nourished.  HENT:  Head: Normocephalic and atraumatic.  Right Ear: External ear normal.  Left Ear: External ear normal.  Nose: Nose normal.  Mouth/Throat: Oropharynx is clear and moist.  Eyes: Conjunctivae and EOM are normal. Pupils are equal, round, and reactive to light.  Neck: Normal range of motion. Neck supple. No JVD present. No tracheal deviation present. No thyromegaly present.  Cardiovascular: Normal rate, regular rhythm, normal heart sounds and intact distal pulses.  Exam reveals no gallop and no friction rub.   No murmur heard. Pulmonary/Chest: Effort normal and breath  sounds normal. No stridor. No respiratory distress. He has no wheezes. He has no rales. He exhibits no tenderness.  Abdominal: Soft. Bowel sounds are normal. He exhibits no distension and no mass. There is no tenderness. There is no rebound and no guarding.  Genitourinary: Penis normal. Guaiac negative stool. No penile tenderness.  Musculoskeletal: Normal range of motion. He exhibits no edema and no tenderness.  Lymphadenopathy:    He has no cervical adenopathy.  Neurological: He is alert and oriented to person, place, and time. He has normal reflexes. No cranial nerve deficit. He exhibits normal muscle tone.  Skin: Skin is warm and dry. No rash noted. No erythema. No pallor.  Psychiatric: He has a normal mood and affect. His behavior is normal. Judgment and thought content normal.          Assessment & Plan:  Healthy male  Chronic neck pain continue followup in the pain clinic  Hyperlipidemia continue Lipitor and aspirin  Allergic rhinitis continue steroid nasal spray  Adult ADD continue Concerta 3 6 mg daily

## 2011-10-17 ENCOUNTER — Other Ambulatory Visit (INDEPENDENT_AMBULATORY_CARE_PROVIDER_SITE_OTHER): Payer: BC Managed Care – PPO

## 2011-10-17 DIAGNOSIS — E039 Hypothyroidism, unspecified: Secondary | ICD-10-CM

## 2011-12-23 ENCOUNTER — Telehealth: Payer: Self-pay

## 2011-12-23 NOTE — Telephone Encounter (Signed)
Okay 

## 2011-12-23 NOTE — Telephone Encounter (Signed)
Received a fax from pt requesting Concerta 36 mg.  Per patient please right for #90.  Rx last filled 09/09/11.   Pt states he had a radiofrequency ablation on a nerve in my neck, which has reduced some of his pain.  Pt has stopped taking Methadone ( was 5mg  BID, under the care of Dr. Jonelle Sports at the [facility]).  Pt states he can manage the pain during the day ok, but he is back to where he was a few years back where he could not sleep.  Pt states back then Dr. Tawanna Cooler was prescribing 1/2 a flexeril and 1/2 a Vicodin at night to allow pt to sleep.  Pt would like to go back on this regimen.  Pls advise if pt needs an office visit to discuss this.

## 2011-12-24 ENCOUNTER — Other Ambulatory Visit: Payer: Self-pay | Admitting: *Deleted

## 2011-12-24 DIAGNOSIS — F988 Other specified behavioral and emotional disorders with onset usually occurring in childhood and adolescence: Secondary | ICD-10-CM

## 2011-12-24 MED ORDER — METHYLPHENIDATE HCL ER (OSM) 36 MG PO TBCR: 36.0000 mg | EXTENDED_RELEASE_TABLET | Freq: Every day | ORAL | Status: DC

## 2011-12-24 NOTE — Telephone Encounter (Signed)
Script is ready for pick up and the other meds he asked about will have to talk with pain clinic at [facility]

## 2012-03-26 ENCOUNTER — Other Ambulatory Visit: Payer: Self-pay | Admitting: Family Medicine

## 2012-03-26 DIAGNOSIS — F988 Other specified behavioral and emotional disorders with onset usually occurring in childhood and adolescence: Secondary | ICD-10-CM

## 2012-03-26 NOTE — Telephone Encounter (Signed)
Pt needs new rx methylphenidate 36 mg #90 °

## 2012-03-29 MED ORDER — METHYLPHENIDATE HCL ER (OSM) 36 MG PO TBCR: 36.0000 mg | EXTENDED_RELEASE_TABLET | Freq: Every day | ORAL | Status: DC

## 2012-03-29 NOTE — Telephone Encounter (Signed)
rx ready for pick up. Left message on machine for patient. 

## 2012-06-25 ENCOUNTER — Other Ambulatory Visit: Payer: Self-pay | Admitting: *Deleted

## 2012-06-25 DIAGNOSIS — F988 Other specified behavioral and emotional disorders with onset usually occurring in childhood and adolescence: Secondary | ICD-10-CM

## 2012-06-25 MED ORDER — METHYLPHENIDATE HCL ER (OSM) 36 MG PO TBCR: 36.0000 mg | EXTENDED_RELEASE_TABLET | Freq: Every day | ORAL | Status: DC

## 2012-08-26 ENCOUNTER — Other Ambulatory Visit: Payer: Self-pay | Admitting: *Deleted

## 2012-08-26 DIAGNOSIS — E039 Hypothyroidism, unspecified: Secondary | ICD-10-CM

## 2012-08-26 MED ORDER — LEVOTHYROXINE SODIUM 50 MCG PO TABS: 50.0000 ug | ORAL_TABLET | Freq: Every day | ORAL | Status: DC

## 2012-09-22 ENCOUNTER — Telehealth: Payer: Self-pay | Admitting: Family Medicine

## 2012-09-22 DIAGNOSIS — F988 Other specified behavioral and emotional disorders with onset usually occurring in childhood and adolescence: Secondary | ICD-10-CM

## 2012-09-22 NOTE — Telephone Encounter (Signed)
Pt is requesting generic concerta 36 mg #90. Pt has appt in aug 2014

## 2012-09-23 MED ORDER — METHYLPHENIDATE HCL ER (OSM) 36 MG PO TBCR: 36.0000 mg | EXTENDED_RELEASE_TABLET | Freq: Every day | ORAL | Status: DC

## 2012-09-23 NOTE — Telephone Encounter (Signed)
Left message on machine Rx ready for pick

## 2012-10-19 ENCOUNTER — Other Ambulatory Visit (INDEPENDENT_AMBULATORY_CARE_PROVIDER_SITE_OTHER): Payer: BC Managed Care – PPO

## 2012-10-19 DIAGNOSIS — Z0000 Encounter for general adult medical examination without abnormal findings: Secondary | ICD-10-CM

## 2012-10-19 DIAGNOSIS — E039 Hypothyroidism, unspecified: Secondary | ICD-10-CM

## 2012-10-19 DIAGNOSIS — E785 Hyperlipidemia, unspecified: Secondary | ICD-10-CM

## 2012-10-19 LAB — CBC WITH DIFFERENTIAL/PLATELET
Eosinophils Absolute: 0.2 10*3/uL (ref 0.0–0.7)
Eosinophils Relative: 2.5 % (ref 0.0–5.0)
HCT: 44.7 % (ref 39.0–52.0)
Lymphs Abs: 2.1 10*3/uL (ref 0.7–4.0)
MCHC: 33.1 g/dL (ref 30.0–36.0)
MCV: 95.5 fl (ref 78.0–100.0)
Monocytes Absolute: 0.5 10*3/uL (ref 0.1–1.0)
Platelets: 197 10*3/uL (ref 150.0–400.0)
RDW: 13.3 % (ref 11.5–14.6)
WBC: 6.3 10*3/uL (ref 4.5–10.5)

## 2012-10-19 LAB — BASIC METABOLIC PANEL
Calcium: 9.3 mg/dL (ref 8.4–10.5)
GFR: 84.94 mL/min (ref >60.00)
Glucose, Bld: 87 mg/dL (ref 70–99)
Sodium: 141 mEq/L (ref 135–145)

## 2012-10-19 LAB — POCT URINALYSIS DIPSTICK
Glucose, UA: NEGATIVE
Ketones, UA: NEGATIVE
Protein, UA: NEGATIVE
Spec Grav, UA: >=1.03

## 2012-10-19 LAB — TSH: TSH: 8.2 u[IU]/mL — ABNORMAL HIGH (ref 0.35–5.50)

## 2012-10-19 LAB — HEPATIC FUNCTION PANEL
AST: 28 U/L (ref 0–37)
Alkaline Phosphatase: 58 U/L (ref 39–117)
Total Bilirubin: 0.5 mg/dL (ref 0.3–1.2)

## 2012-10-19 LAB — LIPID PANEL: HDL: 39.2 mg/dL (ref >39.00)

## 2012-10-20 LAB — LDL CHOLESTEROL, DIRECT: Direct LDL: 143.9 mg/dL

## 2012-10-26 ENCOUNTER — Encounter: Payer: BC Managed Care – PPO | Admitting: Family Medicine

## 2012-10-28 MED ORDER — LEVOTHYROXINE SODIUM 75 MCG PO TABS: 75.0000 ug | ORAL_TABLET | Freq: Every day | ORAL | Status: DC

## 2012-10-28 NOTE — Addendum Note (Signed)
Addended by: Kern Reap B on: 10/28/2012 11:47 AM   Modules accepted: Orders, Medications

## 2012-11-08 ENCOUNTER — Other Ambulatory Visit: Payer: Self-pay | Admitting: Family Medicine

## 2012-12-28 ENCOUNTER — Telehealth: Payer: Self-pay | Admitting: Family Medicine

## 2012-12-28 DIAGNOSIS — F988 Other specified behavioral and emotional disorders with onset usually occurring in childhood and adolescence: Secondary | ICD-10-CM

## 2012-12-28 MED ORDER — METHYLPHENIDATE HCL ER (OSM) 36 MG PO TBCR: 36.0000 mg | EXTENDED_RELEASE_TABLET | Freq: Every day | ORAL | Status: DC

## 2012-12-28 NOTE — Telephone Encounter (Signed)
Pt request refill of methylphenidate (CONCERTA) 36 MG CR tablet 3 months  Pt is out of meds!!

## 2013-01-10 ENCOUNTER — Ambulatory Visit (INDEPENDENT_AMBULATORY_CARE_PROVIDER_SITE_OTHER): Payer: BC Managed Care – PPO | Admitting: Family Medicine

## 2013-01-10 ENCOUNTER — Encounter: Payer: Self-pay | Admitting: Family Medicine

## 2013-01-10 VITALS — BP 130/90 | Temp 98.6°F | Ht 73.25 in | Wt 256.0 lb

## 2013-01-10 DIAGNOSIS — J309 Allergic rhinitis, unspecified: Secondary | ICD-10-CM

## 2013-01-10 DIAGNOSIS — M542 Cervicalgia: Secondary | ICD-10-CM

## 2013-01-10 DIAGNOSIS — B009 Herpesviral infection, unspecified: Secondary | ICD-10-CM

## 2013-01-10 DIAGNOSIS — E785 Hyperlipidemia, unspecified: Secondary | ICD-10-CM

## 2013-01-10 DIAGNOSIS — Z2911 Encounter for prophylactic immunotherapy for respiratory syncytial virus (RSV): Secondary | ICD-10-CM

## 2013-01-10 DIAGNOSIS — Z23 Encounter for immunization: Secondary | ICD-10-CM

## 2013-01-10 DIAGNOSIS — E039 Hypothyroidism, unspecified: Secondary | ICD-10-CM

## 2013-01-10 DIAGNOSIS — F988 Other specified behavioral and emotional disorders with onset usually occurring in childhood and adolescence: Secondary | ICD-10-CM

## 2013-01-10 LAB — TSH: TSH: 2.16 u[IU]/mL (ref 0.35–5.50)

## 2013-01-10 MED ORDER — LEVOTHYROXINE SODIUM 75 MCG PO TABS: 75.0000 ug | ORAL_TABLET | Freq: Every day | ORAL | Status: DC

## 2013-01-10 MED ORDER — LEVOTHYROXINE SODIUM 100 MCG PO TABS: 100.0000 ug | ORAL_TABLET | Freq: Every day | ORAL | Status: DC

## 2013-01-10 MED ORDER — ATORVASTATIN CALCIUM 20 MG PO TABS: ORAL_TABLET | ORAL | Status: DC

## 2013-01-10 MED ORDER — FLUTICASONE PROPIONATE 50 MCG/ACT NA SUSP: 2.0000 | Freq: Every day | NASAL | Status: DC

## 2013-01-10 MED ORDER — ACYCLOVIR 400 MG PO TABS: 400.0000 mg | ORAL_TABLET | Freq: Three times a day (TID) | ORAL | Status: DC

## 2013-01-10 NOTE — Patient Instructions (Addendum)
Continue your current medication  If you would like a consult here about the pain I would recommend Dr. Thyra Breed  Start an exercise program....... walking or swimming 30 minutes daily  TSH level today  I will call you the report

## 2013-01-10 NOTE — Progress Notes (Signed)
  Subjective:    Patient ID: Chase Burke, male    DOB: 1965-07-26, 47 y.o.   MRN: [id number]  HPI Chase Burke is a 47 year old married male nonsmoker who comes in today for general physical examination  He has a history of hyperlipidemia and takes aspirin Lipitor 20 mg daily  He is allergic rhinitis for which he takes steroid nasal spray when necessary  He has a history of hypothyroidism he's on Synthroid 75 mcg daily recent TSH level VIII.20  He takes Concerta 36 mg daily for adult ADD  He takes Naprosyn 2 tabs twice a day he has chronic neck pain. He was seen the folks down at [facility] and the best results he got wasn't RF ablation of some aberrant nerve roots. He's not been going there recently. I recommended Dr. Thyra Breed.  He does not take Ambien he takes Benadryl 25 mg each bedtime when necessary for sleep  He's contemplating a move to [facility]. 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   HENT: Negative.   Eyes: Negative.   Respiratory: Negative.   Cardiovascular: Negative.   Gastrointestinal: Negative.   Endocrine: Negative.   Genitourinary: Negative.   Musculoskeletal: Negative.   Skin: Negative.   Allergic/Immunologic: Negative.   Neurological: Negative.   Hematological: Negative.   Psychiatric/Behavioral: Negative.        Objective:   Physical Exam  Constitutional: He is oriented to person, place, and time. He appears well-developed and well-nourished.  HENT:  Head: Normocephalic and atraumatic.  Right Ear: External ear normal.  Left Ear: External ear normal.  Nose: Nose normal.  Mouth/Throat: Oropharynx is clear and moist.  Eyes: Conjunctivae and EOM are normal. Pupils are equal, round, and reactive to light.  Neck: Normal range of motion. Neck supple. No JVD present. No tracheal deviation present. No thyromegaly present.  Cardiovascular: Normal rate, regular rhythm, normal heart sounds and intact  distal pulses.  Exam reveals no gallop and no friction rub.   No murmur heard. No carotid or aortic bruits peripheral pulses 2+ and symmetrical  Pulmonary/Chest: Effort normal and breath sounds normal. No stridor. No respiratory distress. He has no wheezes. He has no rales. He exhibits no tenderness.  Abdominal: Soft. Bowel sounds are normal. He exhibits no distension and no mass. There is no tenderness. There is no rebound and no guarding.  Genitourinary: Rectum normal, prostate normal and penis normal. Guaiac negative stool. No penile tenderness.  Musculoskeletal: Normal range of motion. He exhibits no edema and no tenderness.  Lymphadenopathy:    He has no cervical adenopathy.  Neurological: He is alert and oriented to person, place, and time. He has normal reflexes. No cranial nerve deficit. He exhibits normal muscle tone.  Skin: Skin is warm and dry. No rash noted. No erythema. No pallor.  Psychiatric: He has a normal mood and affect. His behavior is normal. Judgment and thought content normal.          Assessment & Plan:  Hypothyroidism increase Synthroid to 100 mcg daily  Allergic rhinitis steroid nasal spray when necessary  Hyperlipidemia continue Lipitor and aspirin  History of adult ADD continue Concerta 36 mg daily  Chronic neck pain continue OTC Naprosyn 2 tabs twice a day question consult with Dr. Thyra Breed at the pain clinic here

## 2013-03-21 ENCOUNTER — Telehealth: Payer: Self-pay | Admitting: Family Medicine

## 2013-03-21 DIAGNOSIS — F988 Other specified behavioral and emotional disorders with onset usually occurring in childhood and adolescence: Secondary | ICD-10-CM

## 2013-03-21 MED ORDER — METHYLPHENIDATE HCL ER (OSM) 36 MG PO TBCR: 36.0000 mg | EXTENDED_RELEASE_TABLET | Freq: Every day | ORAL | Status: DC

## 2013-03-21 NOTE — Telephone Encounter (Signed)
Pt request 90 day rx methylphenidate (CONCERTA) 36 MG CR tablet

## 2013-03-21 NOTE — Telephone Encounter (Signed)
Rx ready for pick up and Left message on machine for patient   

## 2013-06-27 ENCOUNTER — Telehealth: Payer: Self-pay | Admitting: Family Medicine

## 2013-06-27 ENCOUNTER — Ambulatory Visit (INDEPENDENT_AMBULATORY_CARE_PROVIDER_SITE_OTHER): Payer: BC Managed Care – PPO | Admitting: Family Medicine

## 2013-06-27 ENCOUNTER — Encounter: Payer: Self-pay | Admitting: Family Medicine

## 2013-06-27 VITALS — BP 140/100 | Temp 97.9°F | Wt 255.0 lb

## 2013-06-27 DIAGNOSIS — I10 Essential (primary) hypertension: Secondary | ICD-10-CM | POA: Insufficient documentation

## 2013-06-27 DIAGNOSIS — J309 Allergic rhinitis, unspecified: Secondary | ICD-10-CM

## 2013-06-27 DIAGNOSIS — F988 Other specified behavioral and emotional disorders with onset usually occurring in childhood and adolescence: Secondary | ICD-10-CM

## 2013-06-27 LAB — TSH: TSH: 1.91 u[IU]/mL (ref 0.35–5.50)

## 2013-06-27 MED ORDER — MONTELUKAST SODIUM 10 MG PO TABS: 10.0000 mg | ORAL_TABLET | Freq: Every day | ORAL | Status: DC

## 2013-06-27 MED ORDER — LISINOPRIL 5 MG PO TABS: 5.0000 mg | ORAL_TABLET | Freq: Every day | ORAL | Status: DC

## 2013-06-27 MED ORDER — METHYLPHENIDATE HCL ER (OSM) 36 MG PO TBCR: 36.0000 mg | EXTENDED_RELEASE_TABLET | Freq: Every day | ORAL | Status: DC

## 2013-06-27 NOTE — Progress Notes (Signed)
   Subjective:    Patient ID: Chase Burke, male    DOB: 1965-11-10, 48 y.o.   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  He's never taken Singulair  He has adult ADD and takes Concerta 36 mg daily  Recently he went to get an eye exam and was noted to have an elevated blood pressure. He's been checking his blood pressure daily at home he's getting in the 160-170 range at home.  He thinks his mom and dad have borderline hypertension but is not sure. No sisters one brother is not sure of.   Review of Systems Negative    Objective:   Physical Exam  Well-developed well-nourished in no acute distress vital signs stable he is afebrile BP right arm sitting position 150/90      Assessment & Plan:  New-onset hypertension,,,,,,,,,,,, discussed lifestyle changes exercise salt restriction add lisinopril 10 mg daily followup in 5 weeks  Allergic rhinitis add Singulair

## 2013-06-27 NOTE — Patient Instructions (Signed)
Lisinopril 5 mg........ one tablet daily in the morning for high blood pressure and  Complete salt free diet  Walk 30 minutes daily  Check your blood pressure daily in the morning.............omron digital pump up blood pressure cuff  Return in 5 weeks for followup  Add Singulair 10 mg..........Marland Kitchen 1 at bedtime for your allergy symptoms

## 2013-06-27 NOTE — Progress Notes (Signed)
Pre visit review using our clinic review tool, if applicable. No additional management support is needed unless otherwise documented below in the visit note. 

## 2013-06-27 NOTE — Telephone Encounter (Signed)
Patient Information:  Caller Name: Chase Burke  Phone: [phone]  Patient: Chase Burke, Chase Burke  Gender: Male  DOB: [birth year]  Age: 48 Years  PCP: Stevie Kern [facility])  Office Follow Up:  Does the office need to follow up with this patient?: No  Instructions For The Office: N/A  RN Note:  Spoke with Chase Burke in the office and she advise that Dr. Sherren Mocha would like to see him. Appnt scheduled for 12:15 06/27/13 and patient agreed.  Symptoms  Reason For Call & Symptoms: BP= 165-170/100-110 for past 4 days and he has felt dizzy on and off and has not felt well. He has needed assistance with walking on several occasions. He feels like heart is racing. Denies chest pain or shortness of breath. He is congested with occasional loose cough productive for clear slight yellow sputum. Afebrile. Requesting appointment.  Reviewed Health History In EMR: Yes  Reviewed Medications In EMR: Yes  Reviewed Allergies In EMR: Yes  Reviewed Surgeries / Procedures: Yes  Date of Onset of Symptoms: 06/24/2013  Guideline(s) Used:  Dizziness  High Blood Pressure  Disposition Per Guideline:   Go to ED Now (or to Office with PCP Approval)  Reason For Disposition Reached:   BP > 160 / 100 and any cardiac or neurologic symptoms (e.g., chest pain, difficulty breathing, unsteady gait, blurred vision)  Advice Given:  Temporary Dizziness  is usually a harmless symptom. It can be caused by not drinking enough water during sports or hot weather. It can also be caused by skipping a meal, too much sun exposure, standing up suddenly, standing too long in one place or even a viral illness.  Some Causes of Temporary Dizziness:  Poor Fluid Intake - Not drinking enough fluids and being a little dehydrated is a common cause of temporary dizziness. This is always worse during hot weather.  Standing Up Suddenly - Standing up suddenly (especially getting out of bed) or prolonged standing in one place are common causes of temporary  dizziness. Not drinking enough fluids always makes it worse. Certain medications can cause or increase this type of dizziness (e.g., blood pressure medications).  Drink Fluids:  Drink several glasses of fruit juice, other clear fluids, or water. This will improve hydration and blood glucose. If you have a fever or have had heat exposure, make sure the fluids are cold.  Call Back If:  Still feel dizzy after 2 hours of rest and fluids  Passes out (faints)  You become worse.  Patient Will Follow Care Advice:  YES  Appointment Scheduled:  06/27/2013 12:15:00 Appointment Scheduled Provider:  Stevie Kern [facility])

## 2013-06-28 ENCOUNTER — Telehealth: Payer: Self-pay | Admitting: Family Medicine

## 2013-06-28 NOTE — Telephone Encounter (Signed)
Relevant patient education mailed to patient.  

## 2013-08-09 ENCOUNTER — Ambulatory Visit (INDEPENDENT_AMBULATORY_CARE_PROVIDER_SITE_OTHER): Payer: BC Managed Care – PPO | Admitting: Family Medicine

## 2013-08-09 ENCOUNTER — Encounter: Payer: Self-pay | Admitting: Family Medicine

## 2013-08-09 VITALS — BP 110/80 | Temp 98.5°F | Wt 246.0 lb

## 2013-08-09 DIAGNOSIS — I10 Essential (primary) hypertension: Secondary | ICD-10-CM

## 2013-08-09 NOTE — Progress Notes (Signed)
   Subjective:    Patient ID: Chase Burke, male    DOB: 11-Jan-1966, 48 y.o.   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

## 2013-08-09 NOTE — Progress Notes (Signed)
Pre visit review using our clinic review tool, if applicable. No additional management support is needed unless otherwise documented below in the visit note. 

## 2013-08-09 NOTE — Patient Instructions (Signed)
Continue your current medications  Physical examination the first week in August  Labs one week prior

## 2013-09-22 ENCOUNTER — Telehealth: Payer: Self-pay | Admitting: Family Medicine

## 2013-09-22 DIAGNOSIS — F988 Other specified behavioral and emotional disorders with onset usually occurring in childhood and adolescence: Secondary | ICD-10-CM

## 2013-09-22 MED ORDER — METHYLPHENIDATE HCL ER (OSM) 36 MG PO TBCR: 36.0000 mg | EXTENDED_RELEASE_TABLET | Freq: Every day | ORAL | Status: DC

## 2013-09-22 NOTE — Telephone Encounter (Signed)
Pt requesting 90 day methylphenidate (CONCERTA) 36 MG CR tablet

## 2013-09-22 NOTE — Telephone Encounter (Signed)
rx printed and signed, unable to reach pt, left rx up at front

## 2013-09-29 ENCOUNTER — Telehealth: Payer: Self-pay | Admitting: *Deleted

## 2013-09-29 DIAGNOSIS — F988 Other specified behavioral and emotional disorders with onset usually occurring in childhood and adolescence: Secondary | ICD-10-CM

## 2013-09-29 MED ORDER — METHYLPHENIDATE HCL ER (OSM) 36 MG PO TBCR: 36.0000 mg | EXTENDED_RELEASE_TABLET | Freq: Every day | ORAL | Status: DC

## 2013-09-29 NOTE — Telephone Encounter (Signed)
Rx  Ready for pick up and Left message on machine for patient. 

## 2013-12-21 ENCOUNTER — Other Ambulatory Visit: Payer: Self-pay | Admitting: *Deleted

## 2013-12-21 DIAGNOSIS — F988 Other specified behavioral and emotional disorders with onset usually occurring in childhood and adolescence: Secondary | ICD-10-CM

## 2013-12-21 MED ORDER — METHYLPHENIDATE HCL ER (OSM) 36 MG PO TBCR: 36.0000 mg | EXTENDED_RELEASE_TABLET | Freq: Every day | ORAL | Status: DC

## 2013-12-21 NOTE — Telephone Encounter (Signed)
Rx ready for pick up and patient is aware 

## 2014-01-05 ENCOUNTER — Other Ambulatory Visit (INDEPENDENT_AMBULATORY_CARE_PROVIDER_SITE_OTHER): Payer: BC Managed Care – PPO

## 2014-01-05 DIAGNOSIS — Z0000 Encounter for general adult medical examination without abnormal findings: Secondary | ICD-10-CM

## 2014-01-05 LAB — CBC WITH DIFFERENTIAL/PLATELET
Basophils Absolute: 0 10*3/uL (ref 0.0–0.1)
Basophils Relative: 0.7 % (ref 0.0–3.0)
EOS ABS: 0.1 10*3/uL (ref 0.0–0.7)
EOS PCT: 1.2 % (ref 0.0–5.0)
HCT: 46.3 % (ref 39.0–52.0)
HEMOGLOBIN: 15.1 g/dL (ref 13.0–17.0)
LYMPHS PCT: 30.4 % (ref 12.0–46.0)
Lymphs Abs: 1.9 10*3/uL (ref 0.7–4.0)
MCHC: 32.7 g/dL (ref 30.0–36.0)
MCV: 96 fl (ref 78.0–100.0)
Monocytes Absolute: 0.5 10*3/uL (ref 0.1–1.0)
Monocytes Relative: 7.4 % (ref 3.0–12.0)
NEUTROS ABS: 3.8 10*3/uL (ref 1.4–7.7)
Neutrophils Relative %: 60.3 % (ref 43.0–77.0)
Platelets: 227 10*3/uL (ref 150.0–400.0)
RBC: 4.82 Mil/uL (ref 4.22–5.81)
RDW: 13.2 % (ref 11.5–15.5)
WBC: 6.3 10*3/uL (ref 4.0–10.5)

## 2014-01-05 LAB — BASIC METABOLIC PANEL
BUN: 15 mg/dL (ref 6–23)
CALCIUM: 9.7 mg/dL (ref 8.4–10.5)
CHLORIDE: 102 meq/L (ref 96–112)
CO2: 25 meq/L (ref 19–32)
Creatinine, Ser: 1.2 mg/dL (ref 0.4–1.5)
GFR: 71.3 mL/min (ref >60.00)
Glucose, Bld: 97 mg/dL (ref 70–99)
Potassium: 4.3 mEq/L (ref 3.5–5.1)
Sodium: 141 mEq/L (ref 135–145)

## 2014-01-05 LAB — HEPATIC FUNCTION PANEL
ALK PHOS: 53 U/L (ref 39–117)
ALT: 30 U/L (ref 0–53)
AST: 25 U/L (ref 0–37)
Albumin: 4.1 g/dL (ref 3.5–5.2)
Bilirubin, Direct: 0 mg/dL (ref 0.0–0.3)
TOTAL PROTEIN: 7.9 g/dL (ref 6.0–8.3)
Total Bilirubin: 0.9 mg/dL (ref 0.2–1.2)

## 2014-01-05 LAB — POCT URINALYSIS DIPSTICK
Bilirubin, UA: NEGATIVE
Glucose, UA: NEGATIVE
Ketones, UA: NEGATIVE
Leukocytes, UA: NEGATIVE
Nitrite, UA: NEGATIVE
PROTEIN UA: NEGATIVE
SPEC GRAV UA: 1.01
Urobilinogen, UA: 0.2
pH, UA: 8.5

## 2014-01-05 LAB — LIPID PANEL
CHOL/HDL RATIO: 5
Cholesterol: 212 mg/dL — ABNORMAL HIGH (ref 0–200)
HDL: 44.4 mg/dL (ref >39.00)
LDL CALC: 140 mg/dL — AB (ref 0–99)
NonHDL: 167.6
Triglycerides: 138 mg/dL (ref 0.0–149.0)
VLDL: 27.6 mg/dL (ref 0.0–40.0)

## 2014-01-05 LAB — TSH: TSH: 2.09 u[IU]/mL (ref 0.35–4.50)

## 2014-01-12 ENCOUNTER — Ambulatory Visit (INDEPENDENT_AMBULATORY_CARE_PROVIDER_SITE_OTHER): Payer: BC Managed Care – PPO | Admitting: Family Medicine

## 2014-01-12 ENCOUNTER — Encounter: Payer: Self-pay | Admitting: Family Medicine

## 2014-01-12 VITALS — BP 110/80 | Temp 99.2°F | Ht 73.0 in | Wt 237.3 lb

## 2014-01-12 DIAGNOSIS — E783 Hyperchylomicronemia: Secondary | ICD-10-CM

## 2014-01-12 DIAGNOSIS — B009 Herpesviral infection, unspecified: Secondary | ICD-10-CM

## 2014-01-12 DIAGNOSIS — M542 Cervicalgia: Secondary | ICD-10-CM

## 2014-01-12 DIAGNOSIS — I10 Essential (primary) hypertension: Secondary | ICD-10-CM

## 2014-01-12 DIAGNOSIS — F988 Other specified behavioral and emotional disorders with onset usually occurring in childhood and adolescence: Secondary | ICD-10-CM

## 2014-01-12 DIAGNOSIS — Z0000 Encounter for general adult medical examination without abnormal findings: Secondary | ICD-10-CM | POA: Insufficient documentation

## 2014-01-12 DIAGNOSIS — Z8042 Family history of malignant neoplasm of prostate: Secondary | ICD-10-CM

## 2014-01-12 DIAGNOSIS — J301 Allergic rhinitis due to pollen: Secondary | ICD-10-CM

## 2014-01-12 DIAGNOSIS — F909 Attention-deficit hyperactivity disorder, unspecified type: Secondary | ICD-10-CM

## 2014-01-12 DIAGNOSIS — E038 Other specified hypothyroidism: Secondary | ICD-10-CM

## 2014-01-12 DIAGNOSIS — E785 Hyperlipidemia, unspecified: Secondary | ICD-10-CM

## 2014-01-12 MED ORDER — FLUTICASONE PROPIONATE 50 MCG/ACT NA SUSP: 2.0000 | Freq: Every day | NASAL | Status: AC

## 2014-01-12 MED ORDER — ACYCLOVIR 400 MG PO TABS: 400.0000 mg | ORAL_TABLET | Freq: Three times a day (TID) | ORAL | Status: DC

## 2014-01-12 MED ORDER — MONTELUKAST SODIUM 10 MG PO TABS: 10.0000 mg | ORAL_TABLET | Freq: Every day | ORAL | Status: DC

## 2014-01-12 MED ORDER — ATORVASTATIN CALCIUM 20 MG PO TABS: ORAL_TABLET | ORAL | Status: DC

## 2014-01-12 MED ORDER — LISINOPRIL 5 MG PO TABS: 5.0000 mg | ORAL_TABLET | Freq: Every day | ORAL | Status: DC

## 2014-01-12 MED ORDER — LEVOTHYROXINE SODIUM 75 MCG PO TABS: 75.0000 ug | ORAL_TABLET | Freq: Every day | ORAL | Status: DC

## 2014-01-12 NOTE — Progress Notes (Signed)
   Subjective:    Patient ID: Chase Burke, male    DOB: [birth year], 48 y.o.   MRN: [id number]  HPI Chase Burke is a 48 year old married male nonsmoker who comes in today for general physical examination because of a history of hyperlipidemia, allergic rhinitis, hypothyroidism, hypertension, adult ADD,  Med list reviewed the been no changes.  He gets routine eye care, dental care, colonoscopy at age 53 because no family history of colon cancer or polyps. He should get a PSA yearly his father developed prostate cancer around mid 28s.  Social history unchanged he's married he went to undergraduate at [employer] and Masters in PhD at [employer].  He would like a referral to the pain clinic because of the chronic pain in his shoulder. And neck   Review of Systems  Constitutional: Negative.   HENT: Negative.   Eyes: Negative.   Respiratory: Negative.   Cardiovascular: Negative.   Gastrointestinal: Negative.   Endocrine: Negative.   Genitourinary: Negative.   Musculoskeletal: Negative.   Skin: Negative.   Allergic/Immunologic: Negative.   Neurological: Negative.   Hematological: Negative.   Psychiatric/Behavioral: Negative.        Objective:   Physical Exam  Nursing note and vitals reviewed. Constitutional: He is oriented to person, place, and time. He appears well-developed and well-nourished.  HENT:  Head: Normocephalic and atraumatic.  Right Ear: External ear normal.  Left Ear: External ear normal.  Nose: Nose normal.  Mouth/Throat: Oropharynx is clear and moist.  Eyes: Conjunctivae and EOM are normal. Pupils are equal, round, and reactive to light.  Neck: Normal range of motion. Neck supple. No JVD present. No tracheal deviation present. No thyromegaly present.  Cardiovascular: Normal rate, regular rhythm, normal heart sounds and intact distal pulses.  Exam reveals no gallop and no friction rub.   No murmur heard. Pulmonary/Chest: Effort normal and breath sounds normal. No stridor. No  respiratory distress. He has no wheezes. He has no rales. He exhibits no tenderness.  Abdominal: Soft. Bowel sounds are normal. He exhibits no distension and no mass. There is no tenderness. There is no rebound and no guarding.  Genitourinary: Rectum normal, prostate normal and penis normal. Guaiac negative stool. No penile tenderness.  Musculoskeletal: Normal range of motion. He exhibits no edema and no tenderness.  Lymphadenopathy:    He has no cervical adenopathy.  Neurological: He is alert and oriented to person, place, and time. He has normal reflexes. No cranial nerve deficit. He exhibits normal muscle tone.  Skin: Skin is warm and dry. No rash noted. No erythema. No pallor.  Total body skin exam normal  Psychiatric: He has a normal mood and affect. His behavior is normal. Judgment and thought content normal.          Assessment & Plan:  History of occasional HSV-1 refill acyclovir  Hyperlipidemia continue Lipitor and aspirin  Allergic rhinitis continue Zyrtec steroid nasal spray and Singulair  Hypothyroidism continue Synthroid  Hypertension continue lisinopril  Neck and back and shoulder pain....... patient requesting a consult at the pain clinic  Positive family history of prostate cancer

## 2014-01-12 NOTE — Progress Notes (Signed)
Pre visit review using our clinic review tool, if applicable. No additional management support is needed unless otherwise documented below in the visit note. 

## 2014-01-12 NOTE — Patient Instructions (Signed)
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

## 2014-01-13 LAB — PSA: PSA: 1.18 ng/mL (ref 0.10–4.00)

## 2014-03-03 ENCOUNTER — Encounter: Payer: Self-pay | Admitting: Physical Medicine & Rehabilitation

## 2014-03-23 ENCOUNTER — Telehealth: Payer: Self-pay

## 2014-03-23 DIAGNOSIS — F988 Other specified behavioral and emotional disorders with onset usually occurring in childhood and adolescence: Secondary | ICD-10-CM

## 2014-03-23 MED ORDER — METHYLPHENIDATE HCL ER (OSM) 36 MG PO TBCR: 36.0000 mg | EXTENDED_RELEASE_TABLET | Freq: Every day | ORAL | Status: DC

## 2014-03-23 NOTE — Telephone Encounter (Signed)
Rx request for Concerta 36 mg tablet #42  Pls advise.

## 2014-03-23 NOTE — Telephone Encounter (Signed)
Rx ready for pick up. 

## 2014-04-04 ENCOUNTER — Ambulatory Visit: Payer: BC Managed Care – PPO | Admitting: Physical Medicine & Rehabilitation

## 2014-06-20 ENCOUNTER — Telehealth: Payer: Self-pay | Admitting: Family Medicine

## 2014-06-20 DIAGNOSIS — F988 Other specified behavioral and emotional disorders with onset usually occurring in childhood and adolescence: Secondary | ICD-10-CM

## 2014-06-20 DIAGNOSIS — M542 Cervicalgia: Secondary | ICD-10-CM

## 2014-06-20 MED ORDER — METHYLPHENIDATE HCL ER (OSM) 36 MG PO TBCR: 36.0000 mg | EXTENDED_RELEASE_TABLET | Freq: Every day | ORAL | Status: DC

## 2014-06-20 NOTE — Telephone Encounter (Signed)
.  Pt request refill methylphenidate (CONCERTA) 36 MG PO CR tablet Pt states this was faxed in feb 16. 90 day rx   2.  Pt has requested referral for pain clinic in January and it was sent to wrong one. Wife states thye have been waitng fpr a cb Pt wants  to to go to: [facility].    Dr Elta Guadeloupe phillips [phone]  Pt needs appt asap.

## 2014-06-20 NOTE — Telephone Encounter (Addendum)
Error

## 2014-06-21 NOTE — Telephone Encounter (Signed)
Pt notified Rx ready for pickup. Rx printed and signed.  

## 2014-07-10 ENCOUNTER — Telehealth: Payer: Self-pay | Admitting: Family Medicine

## 2014-07-10 NOTE — Telephone Encounter (Signed)
Pt wife called in and wanted to know if you would take on her husband as a pt.  She is your pt currently   Best number [phone]

## 2014-07-11 NOTE — Telephone Encounter (Signed)
Did not realize Dr. Sherren Mocha was on leave.  Will establish with Dr. Jenny Reichmann.

## 2014-07-11 NOTE — Telephone Encounter (Signed)
Is this ok for patient to switch?

## 2014-07-11 NOTE — Telephone Encounter (Signed)
Ok with me 

## 2014-08-09 ENCOUNTER — Ambulatory Visit (INDEPENDENT_AMBULATORY_CARE_PROVIDER_SITE_OTHER): Payer: BC Managed Care – PPO | Admitting: Internal Medicine

## 2014-08-09 ENCOUNTER — Encounter: Payer: Self-pay | Admitting: Internal Medicine

## 2014-08-09 VITALS — BP 130/82 | HR 85 | Temp 98.6°F | Wt 245.1 lb

## 2014-08-09 DIAGNOSIS — Z0000 Encounter for general adult medical examination without abnormal findings: Secondary | ICD-10-CM

## 2014-08-09 DIAGNOSIS — G894 Chronic pain syndrome: Secondary | ICD-10-CM | POA: Insufficient documentation

## 2014-08-09 NOTE — Progress Notes (Signed)
Pre visit review using our clinic review tool, if applicable. No additional management support is needed unless otherwise documented below in the visit note. 

## 2014-08-09 NOTE — Assessment & Plan Note (Addendum)
Overall doing well, age appropriate education and counseling updated, referrals for preventative services and immunizations addressed, dietary and smoking counseling addressed, most recent labs reviewed.  I have personally reviewed and have noted:  1) the patient's medical and social history 2) The pt's use of alcohol, tobacco, and illicit drugs 3) The patient's current medications and supplements 4) Functional ability including ADL's, fall risk, home safety risk, hearing and visual impairment 5) Diet and physical activities 6) Evidence for depression or mood disorder 7) The patient's height, weight, and BMI have been recorded in the chart  I have made referrals, and provided counseling and education based on review of the above Declines labs today, will have done next visit

## 2014-08-09 NOTE — Progress Notes (Signed)
Subjective:    Patient ID: Chase Burke, male    DOB: [birth year], 49 y.o.   MRN: [id number]  HPI  Here for wellness and f/u;  Overall doing ok;  Pt denies Chest pain, worsening SOB, DOE, wheezing, orthopnea, PND, worsening LE edema, palpitations, dizziness or syncope.  Pt denies neurological change such as new headache, facial or extremity weakness.  Pt denies polydipsia, polyuria, or low sugar symptoms. Pt states overall good compliance with treatment and medications, good tolerability, and has been trying to follow appropriate diet.  Pt denies worsening depressive symptoms, suicidal ideation or panic. No fever, night sweats, wt loss, loss of appetite, or other constitutional symptoms.  Pt states good ability with ADL's, has low fall risk, home safety reviewed and adequate, no other significant changes in hearing or vision, and only occasionally active with exercise.  Has ongoing chronic pain to neck and right shoudler, seeing Dr Ellene Route for neck s/p surgury, no further usrgury needed in last year though pain has persisted ; has seen [facility] for a time, on methadone briefly, s/p nerve ablation procedure x 2,  has been referred local pain management as well. Wants to have further ablation as the first one helped. Past Medical History  Diagnosis Date  . ADD (attention deficit disorder with hyperactivity)   . Hyperlipidemia    Past Surgical History  Procedure Laterality Date  . Cervical disc surgery  07/2007  . Shoulder surgery      right  . Breast surgery      reduction  . Tonsillectomy      reports that he has quit smoking. He does not have any smokeless tobacco history on file. He reports that he drinks alcohol. His drug history is not on file. family history includes Arthritis in an other family member; Hyperlipidemia in an other family member; Hypertension in an other family member; Kidney disease in an other family member. Allergies  Allergen Reactions  . Ibuprofen Other (See  Comments)    Difficulty concentrating   Current Outpatient Prescriptions on File Prior to Visit  Medication Sig Dispense Refill  . acyclovir (ZOVIRAX) 400 MG tablet Take 1 tablet (400 mg total) by mouth 3 (three) times daily. 90 tablet 6  . aspirin 325 MG tablet Take 325 mg by mouth daily.      Marland Kitchen atorvastatin (LIPITOR) 20 MG tablet TAKE 1 TABLET BY MOUTH DAILY. 100 tablet 3  . cetirizine (ZYRTEC) 10 MG tablet Take 10 mg by mouth daily.    . diphenhydrAMINE (BENADRYL) 25 MG tablet Take 25 mg by mouth every 6 (six) hours as needed.    . fluticasone (FLONASE) 50 MCG/ACT nasal spray Place 2 sprays into both nostrils daily. 16 g 6  . levothyroxine (SYNTHROID, LEVOTHROID) 75 MCG tablet Take 1 tablet (75 mcg total) by mouth daily. 100 tablet 3  . lisinopril (PRINIVIL,ZESTRIL) 5 MG tablet Take 1 tablet (5 mg total) by mouth daily. 90 tablet 3  . methylphenidate (CONCERTA) 36 MG PO CR tablet Take 1 tablet (36 mg total) by mouth daily. 90 tablet 0  . montelukast (SINGULAIR) 10 MG tablet Take 1 tablet (10 mg total) by mouth at bedtime. 100 tablet 3  . Multiple Vitamin (MULTIVITAMIN) tablet Take 1 tablet by mouth daily.    . naproxen (NAPROSYN) 250 MG tablet 2 tabs twice daily     No current facility-administered medications on file prior to visit.    Review of Systems Constitutional: Negative for increased diaphoresis, other activity, appetite  or siginficant weight change other than noted HENT: Negative for worsening hearing loss, ear pain, facial swelling, mouth sores and neck stiffness.   Eyes: Negative for other worsening pain, redness or visual disturbance.  Respiratory: Negative for shortness of breath and wheezing  Cardiovascular: Negative for chest pain and palpitations.  Gastrointestinal: Negative for diarrhea, blood in stool, abdominal distention or other pain Genitourinary: Negative for hematuria, flank pain or change in urine volume.  Musculoskeletal: Negative for myalgias or other joint  complaints.  Skin: Negative for color change and wound or drainage.  Neurological: Negative for syncope and numbness. other than noted Hematological: Negative for adenopathy. or other swelling Psychiatric/Behavioral: Negative for hallucinations, SI, self-injury, decreased concentration or other worsening agitation.      Objective:   Physical Exam BP 130/82 mmHg  Pulse 85  Temp(Src) 98.6 F (37 C) (Oral)  Wt 245 lb 1.3 oz (111.168 kg)  SpO2 97% VS noted,  Constitutional: Pt is oriented to person, place, and time. Appears well-developed and well-nourished, in no significant distress Head: Normocephalic and atraumatic.  Right Ear: External ear normal.  Left Ear: External ear normal.  Nose: Nose normal.  Mouth/Throat: Oropharynx is clear and moist.  Eyes: Conjunctivae and EOM are normal. Pupils are equal, round, and reactive to light.  Neck: Normal range of motion. Neck supple. No JVD present. No tracheal deviation present or significant neck LA or mass Cardiovascular: Normal rate, regular rhythm, normal heart sounds and intact distal pulses.   Pulmonary/Chest: Effort normal and breath sounds without rales or wheezing  Abdominal: Soft. Bowel sounds are normal. NT. No HSM  Musculoskeletal: Normal range of motion. Exhibits no edema.  Lymphadenopathy:  Has no cervical adenopathy.  Neurological: Pt is alert and oriented to person, place, and time. Pt has normal reflexes. No cranial nerve deficit. Motor grossly intact Skin: Skin is warm and dry. No rash noted.  Psychiatric:  Has nervous mood and affect. Behavior is normal.     Assessment & Plan:

## 2014-08-09 NOTE — Assessment & Plan Note (Signed)
Ongoing neck and shoulders x 20 yrs, for tramadol prn, refer pain management

## 2014-08-09 NOTE — Addendum Note (Signed)
Addended by: Biagio Borg on: 08/09/2014 04:02 PM   Modules accepted: Orders

## 2014-08-09 NOTE — Patient Instructions (Addendum)

## 2014-08-16 ENCOUNTER — Telehealth: Payer: Self-pay | Admitting: Internal Medicine

## 2014-08-16 MED ORDER — TRAMADOL HCL 50 MG PO TABS: 50.0000 mg | ORAL_TABLET | Freq: Two times a day (BID) | ORAL | Status: DC | PRN

## 2014-08-16 NOTE — Telephone Encounter (Signed)
States Dr. Jenny Reichmann told patient tramadol would be sent to his pharmacy - [facility] on [address].  Did not see this on med list.  Did inform wife that Dr. Jenny Reichmann was out of the office this week.  Please follow up with patient in regards.

## 2014-08-16 NOTE — Telephone Encounter (Signed)
Medication printed and signed.

## 2014-08-16 NOTE — Telephone Encounter (Signed)
Please advise, pt was referred to pain management but MD also states Tramadol PRN for ongoing neck and shoulder pain.

## 2014-08-17 NOTE — Telephone Encounter (Signed)
Rx faxed to pharmacy  

## 2014-08-21 ENCOUNTER — Telehealth: Payer: Self-pay | Admitting: Internal Medicine

## 2014-08-21 NOTE — Telephone Encounter (Signed)
error 

## 2014-09-20 ENCOUNTER — Telehealth: Payer: Self-pay

## 2014-09-20 DIAGNOSIS — F988 Other specified behavioral and emotional disorders with onset usually occurring in childhood and adolescence: Secondary | ICD-10-CM

## 2014-09-20 MED ORDER — TRAMADOL HCL 50 MG PO TABS: 50.0000 mg | ORAL_TABLET | Freq: Four times a day (QID) | ORAL | Status: DC | PRN

## 2014-09-20 MED ORDER — METHYLPHENIDATE HCL ER (OSM) 36 MG PO TBCR: 36.0000 mg | EXTENDED_RELEASE_TABLET | Freq: Every day | ORAL | Status: DC

## 2014-09-20 NOTE — Telephone Encounter (Signed)
[place] for [place] hardcopy to [employer] to defer treatment decision on tramadol to [employer] -

## 2014-09-20 NOTE — Telephone Encounter (Signed)
The August 16 2014 rx for tramadol is listed as rx per G Cal[facility]  At any rate, ok for tramadol 50 mg qid prn  I decline to treat with higher potent medication:  I do not treat chronic pain , and do not regularly prescribe schedule II or higher medications such as hydrocodone or oxycodone due to increased scrutiny by the [facility] toward all physicians who prescribe these medications

## 2014-09-20 NOTE — Telephone Encounter (Signed)
I filled the medication because Dr. Jenny Reichmann was out of the office and patient was expecting Tramadol per his notes. I have never seen this patient for this. Defer increase in medication to Dr. Jenny Reichmann.

## 2014-09-20 NOTE — Telephone Encounter (Signed)
Pt is requesting refills of Concerta #55 (the maximum for [employer] employees), he is also requesting a refill of Tramadol 50 however he is questioning the dosage prescribed by Terri Piedra. He says that this dosage does not control his pain as well as he had hoped.

## 2014-09-20 NOTE — Telephone Encounter (Signed)
Pt advised, both Rx's are in cabinet for pt pick up

## 2014-09-22 ENCOUNTER — Telehealth: Payer: Self-pay | Admitting: Internal Medicine

## 2014-09-22 NOTE — Telephone Encounter (Signed)
Pt picked Rx for Tramadol and Concerta 81/59/4707 per log

## 2014-09-22 NOTE — Telephone Encounter (Signed)
Patient need refill of Tramadol

## 2014-12-11 ENCOUNTER — Telehealth: Payer: Self-pay | Admitting: Internal Medicine

## 2014-12-11 DIAGNOSIS — F988 Other specified behavioral and emotional disorders with onset usually occurring in childhood and adolescence: Secondary | ICD-10-CM

## 2014-12-11 NOTE — Telephone Encounter (Signed)
Pt requesting refill for methylphenidate (CONCERTA) 36 MG PO CR tablet [[id number]] 90 day Pharmacy is [facility] I did get him scheduled for an annual on 11/4

## 2014-12-12 MED ORDER — METHYLPHENIDATE HCL ER (OSM) 36 MG PO TBCR: 36.0000 mg | EXTENDED_RELEASE_TABLET | Freq: Every day | ORAL | Status: DC

## 2014-12-12 NOTE — Telephone Encounter (Signed)
Ok, but not due for refill until oct 2

## 2014-12-13 NOTE — Telephone Encounter (Signed)
Pt informed, Rx in cabinet for pt pick up  

## 2014-12-27 ENCOUNTER — Other Ambulatory Visit: Payer: Self-pay | Admitting: Internal Medicine

## 2014-12-27 NOTE — Telephone Encounter (Signed)
Rx faxed to pharmacy  

## 2015-01-19 ENCOUNTER — Other Ambulatory Visit: Payer: Self-pay | Admitting: Internal Medicine

## 2015-01-19 ENCOUNTER — Encounter: Payer: Self-pay | Admitting: Internal Medicine

## 2015-01-19 ENCOUNTER — Other Ambulatory Visit (INDEPENDENT_AMBULATORY_CARE_PROVIDER_SITE_OTHER): Payer: BC Managed Care – PPO

## 2015-01-19 ENCOUNTER — Ambulatory Visit (INDEPENDENT_AMBULATORY_CARE_PROVIDER_SITE_OTHER): Payer: BC Managed Care – PPO | Admitting: Internal Medicine

## 2015-01-19 VITALS — BP 110/80 | HR 80 | Ht 73.0 in | Wt 236.0 lb

## 2015-01-19 DIAGNOSIS — Z0000 Encounter for general adult medical examination without abnormal findings: Secondary | ICD-10-CM

## 2015-01-19 DIAGNOSIS — E039 Hypothyroidism, unspecified: Secondary | ICD-10-CM

## 2015-01-19 DIAGNOSIS — I10 Essential (primary) hypertension: Secondary | ICD-10-CM | POA: Diagnosis not present

## 2015-01-19 DIAGNOSIS — E785 Hyperlipidemia, unspecified: Secondary | ICD-10-CM

## 2015-01-19 DIAGNOSIS — R7989 Other specified abnormal findings of blood chemistry: Secondary | ICD-10-CM

## 2015-01-19 DIAGNOSIS — B009 Herpesviral infection, unspecified: Secondary | ICD-10-CM

## 2015-01-19 DIAGNOSIS — E783 Hyperchylomicronemia: Secondary | ICD-10-CM

## 2015-01-19 LAB — CBC WITH DIFFERENTIAL/PLATELET
BASOS ABS: 0.1 10*3/uL (ref 0.0–0.1)
Basophils Relative: 1 % (ref 0.0–3.0)
EOS PCT: 0.9 % (ref 0.0–5.0)
Eosinophils Absolute: 0.1 10*3/uL (ref 0.0–0.7)
HCT: 44.6 % (ref 39.0–52.0)
Hemoglobin: 15.1 g/dL (ref 13.0–17.0)
LYMPHS ABS: 1.8 10*3/uL (ref 0.7–4.0)
Lymphocytes Relative: 29.8 % (ref 12.0–46.0)
MCHC: 33.9 g/dL (ref 30.0–36.0)
MCV: 94.7 fl (ref 78.0–100.0)
MONO ABS: 0.5 10*3/uL (ref 0.1–1.0)
MONOS PCT: 8.2 % (ref 3.0–12.0)
NEUTROS ABS: 3.6 10*3/uL (ref 1.4–7.7)
NEUTROS PCT: 60.1 % (ref 43.0–77.0)
PLATELETS: 256 10*3/uL (ref 150.0–400.0)
RBC: 4.71 Mil/uL (ref 4.22–5.81)
RDW: 13.3 % (ref 11.5–15.5)
WBC: 6 10*3/uL (ref 4.0–10.5)

## 2015-01-19 LAB — URINALYSIS, ROUTINE W REFLEX MICROSCOPIC
Bilirubin Urine: NEGATIVE
Hgb urine dipstick: NEGATIVE
Ketones, ur: NEGATIVE
LEUKOCYTES UA: NEGATIVE
Nitrite: NEGATIVE
PH: 7 (ref 5.0–8.0)
Specific Gravity, Urine: <=1.005 — AB (ref 1.000–1.030)
TOTAL PROTEIN, URINE-UPE24: NEGATIVE
URINE GLUCOSE: NEGATIVE
Urobilinogen, UA: 0.2 (ref 0.0–1.0)
WBC, UA: NONE SEEN (ref 0–?)

## 2015-01-19 LAB — BASIC METABOLIC PANEL
BUN: 9 mg/dL (ref 6–23)
CO2: 31 meq/L (ref 19–32)
Calcium: 10.4 mg/dL (ref 8.4–10.5)
Chloride: 100 mEq/L (ref 96–112)
Creatinine, Ser: 1.1 mg/dL (ref 0.40–1.50)
GFR: 76.21 mL/min (ref >60.00)
GLUCOSE: 104 mg/dL — AB (ref 70–99)
POTASSIUM: 4.8 meq/L (ref 3.5–5.1)
SODIUM: 139 meq/L (ref 135–145)

## 2015-01-19 LAB — HEPATIC FUNCTION PANEL
ALBUMIN: 4.5 g/dL (ref 3.5–5.2)
ALK PHOS: 53 U/L (ref 39–117)
ALT: 26 U/L (ref 0–53)
AST: 22 U/L (ref 0–37)
BILIRUBIN DIRECT: 0.2 mg/dL (ref 0.0–0.3)
TOTAL PROTEIN: 7.3 g/dL (ref 6.0–8.3)
Total Bilirubin: 0.8 mg/dL (ref 0.2–1.2)

## 2015-01-19 LAB — PSA: PSA: 0.8 ng/mL (ref 0.10–4.00)

## 2015-01-19 LAB — LIPID PANEL
Cholesterol: 214 mg/dL — ABNORMAL HIGH (ref 0–200)
HDL: 46.1 mg/dL (ref >39.00)
NONHDL: 167.9
Total CHOL/HDL Ratio: 5
Triglycerides: 231 mg/dL — ABNORMAL HIGH (ref 0.0–149.0)
VLDL: 46.2 mg/dL — ABNORMAL HIGH (ref 0.0–40.0)

## 2015-01-19 LAB — TSH: TSH: 5.8 u[IU]/mL — AB (ref 0.35–4.50)

## 2015-01-19 LAB — LDL CHOLESTEROL, DIRECT: Direct LDL: 140 mg/dL

## 2015-01-19 MED ORDER — ATORVASTATIN CALCIUM 20 MG PO TABS: ORAL_TABLET | ORAL | Status: DC

## 2015-01-19 MED ORDER — ROSUVASTATIN CALCIUM 40 MG PO TABS: 40.0000 mg | ORAL_TABLET | Freq: Every day | ORAL | Status: DC

## 2015-01-19 MED ORDER — LEVOTHYROXINE SODIUM 75 MCG PO TABS: 75.0000 ug | ORAL_TABLET | Freq: Every day | ORAL | Status: DC

## 2015-01-19 MED ORDER — ACYCLOVIR 400 MG PO TABS: 400.0000 mg | ORAL_TABLET | Freq: Three times a day (TID) | ORAL | Status: DC

## 2015-01-19 MED ORDER — LISINOPRIL 5 MG PO TABS: 5.0000 mg | ORAL_TABLET | Freq: Every day | ORAL | Status: DC

## 2015-01-19 NOTE — Assessment & Plan Note (Signed)
stable overall by history and exam, recent data reviewed with pt, and pt to continue medical treatment as before,  to f/u any worsening symptoms or concerns, for f/u labs today 

## 2015-01-19 NOTE — Assessment & Plan Note (Deleted)

## 2015-01-19 NOTE — Progress Notes (Signed)
Subjective:    Patient ID: Chase Burke, male    DOB: 02/18/1966, 49 y.o.   MRN: [id number]  HPI  Here to f/u; overall doing ok,  Pt denies chest pain, increasing sob or doe, wheezing, orthopnea, PND, increased LE swelling, palpitations, dizziness or syncope.  Pt denies new neurological symptoms such as new headache, or facial or extremity weakness or numbness.  Pt denies polydipsia, polyuria, or low sugar episode.   Pt denies new neurological symptoms such as new headache, or facial or extremity weakness or numbness.   Pt states overall good compliance with meds, mostly trying to follow appropriate diet, with wt overall stable,  but little exercise however.  Had a severe reaction to flu shot last yr with arm swelling, fever, felt terrible,  Denies hyper or hypo thyroid symptoms such as voice, skin or hair change.. Past Medical History  Diagnosis Date  . ADD (attention deficit disorder with hyperactivity)   . Hyperlipidemia    Past Surgical History  Procedure Laterality Date  . Cervical disc surgery  07/2007  . Shoulder surgery      right  . Breast surgery      reduction  . Tonsillectomy      reports that he has quit smoking. He does not have any smokeless tobacco history on file. He reports that he drinks alcohol. His drug history is not on file. family history includes Arthritis in an other family member; Hyperlipidemia in an other family member; Hypertension in an other family member; Kidney disease in an other family member. Allergies  Allergen Reactions  . Ibuprofen Other (See Comments)    Difficulty concentrating   Current Outpatient Prescriptions on File Prior to Visit  Medication Sig Dispense Refill  . acyclovir (ZOVIRAX) 400 MG tablet Take 1 tablet (400 mg total) by mouth 3 (three) times daily. 90 tablet 6  . aspirin 325 MG tablet Take 325 mg by mouth daily.      Marland Kitchen atorvastatin (LIPITOR) 20 MG tablet TAKE 1 TABLET BY MOUTH DAILY. 100 tablet 3  . cetirizine (ZYRTEC) 10 MG  tablet Take 10 mg by mouth daily.    . diphenhydrAMINE (BENADRYL) 25 MG tablet Take 25 mg by mouth every 6 (six) hours as needed.    . fluticasone (FLONASE) 50 MCG/ACT nasal spray Place 2 sprays into both nostrils daily. 16 g 6  . levothyroxine (SYNTHROID, LEVOTHROID) 75 MCG tablet Take 1 tablet (75 mcg total) by mouth daily. 100 tablet 3  . lisinopril (PRINIVIL,ZESTRIL) 5 MG tablet Take 1 tablet (5 mg total) by mouth daily. 90 tablet 3  . Melatonin 3 MG TABS Take 3 mg by mouth at bedtime.    . methylphenidate (CONCERTA) 36 MG PO CR tablet Take 1 tablet (36 mg total) by mouth daily. To fill Dec 17, 2014 90 tablet 0  . montelukast (SINGULAIR) 10 MG tablet Take 1 tablet (10 mg total) by mouth at bedtime. 100 tablet 3  . Multiple Vitamin (MULTIVITAMIN) tablet Take 1 tablet by mouth daily.    . naproxen (NAPROSYN) 250 MG tablet 2 tabs twice daily    . traMADol (ULTRAM) 50 MG tablet TAKE 1 TABLET BY MOUTH 4 TIMES A DAY AS NEEDED. 120 tablet 0   No current facility-administered medications on file prior to visit.   Review of Systems  Constitutional: Negative for unusual diaphoresis or night sweats HENT: Negative for ringing in ear or discharge Eyes: Negative for double vision or worsening visual disturbance.  Respiratory: Negative for choking  and stridor.   Gastrointestinal: Negative for vomiting or other signifcant bowel change Genitourinary: Negative for hematuria or change in urine volume.  Musculoskeletal: Negative for other MSK pain or swelling Skin: Negative for color change and worsening wound.  Neurological: Negative for tremors and numbness other than noted  Psychiatric/Behavioral: Negative for decreased concentration or agitation other than above       Objective:   Physical Exam BP 110/80 mmHg  Pulse 80  Ht 6\' 1"  (1.854 m)  Wt 236 lb (107.049 kg)  BMI 31.14 kg/m2  SpO2 98% VS noted,  Constitutional: Pt appears in no significant distress HENT: Head: NCAT.  Right Ear: External  ear normal.  Left Ear: External ear normal.  Eyes: . Pupils are equal, round, and reactive to light. Conjunctivae and EOM are normal Neck: Normal range of motion. Neck supple.  Cardiovascular: Normal rate and regular rhythm.   Pulmonary/Chest: Effort normal and breath sounds without rales or wheezing.  Abd:  Soft, NT, ND, + BS Neurological: Pt is alert. Not confused , motor grossly intact Skin: Skin is warm. No rash, no LE edema Psychiatric: Pt behavior is normal. No agitation.      Assessment & Plan:

## 2015-01-19 NOTE — Assessment & Plan Note (Signed)
stable overall by history and exam, recent data reviewed with pt, and pt to continue medical treatment as before,  to f/u any worsening symptoms or concerns Lab Results  Component Value Date   LDLCALC 140* 01/05/2014

## 2015-01-19 NOTE — Assessment & Plan Note (Signed)
stable overall by history and exam, recent data reviewed with pt, and pt to continue medical treatment as before,  to f/u any worsening symptoms or concerns BP Readings from Last 3 Encounters:  01/19/15 110/80  08/09/14 130/82  01/12/14 110/80

## 2015-01-19 NOTE — Progress Notes (Signed)
Pre visit review using our clinic review tool, if applicable. No additional management support is needed unless otherwise documented below in the visit note. 

## 2015-01-19 NOTE — Patient Instructions (Signed)

## 2015-03-16 ENCOUNTER — Other Ambulatory Visit: Payer: Self-pay

## 2015-03-16 DIAGNOSIS — F988 Other specified behavioral and emotional disorders with onset usually occurring in childhood and adolescence: Secondary | ICD-10-CM

## 2015-03-16 MED ORDER — METHYLPHENIDATE HCL ER (OSM) 36 MG PO TBCR: 36.0000 mg | EXTENDED_RELEASE_TABLET | Freq: Every day | ORAL | Status: DC

## 2015-03-16 NOTE — Telephone Encounter (Signed)
Done hardcopy to Dahlia  

## 2015-03-16 NOTE — Telephone Encounter (Signed)
Pt informed, Rx in cabinet for pt pick up  

## 2015-06-21 ENCOUNTER — Telehealth: Payer: Self-pay | Admitting: *Deleted

## 2015-06-21 DIAGNOSIS — F988 Other specified behavioral and emotional disorders with onset usually occurring in childhood and adolescence: Secondary | ICD-10-CM

## 2015-06-21 MED ORDER — METHYLPHENIDATE HCL ER (OSM) 36 MG PO TBCR: 36.0000 mg | EXTENDED_RELEASE_TABLET | Freq: Every day | ORAL | Status: DC

## 2015-06-21 NOTE — Telephone Encounter (Signed)
Requesting refill on husband Concerta...Chase Burke

## 2015-06-21 NOTE — Telephone Encounter (Signed)
Done Done hardcopy to Corinne  

## 2015-06-26 ENCOUNTER — Telehealth: Payer: Self-pay

## 2015-06-26 NOTE — Telephone Encounter (Signed)
PA initiated and APPROVED  via CoverMyMeds key Cherylin Mylar

## 2015-07-30 ENCOUNTER — Telehealth: Payer: Self-pay | Admitting: Internal Medicine

## 2015-07-30 NOTE — Telephone Encounter (Signed)
Pt's spouse called and is requesting transfer for her husband from Dr. Jenny Reichmann to Dr. Danise Mina. Would this be ok with both of you? If so, I will contact patient at [phone]

## 2015-08-21 ENCOUNTER — Encounter: Payer: Self-pay | Admitting: Family Medicine

## 2015-08-21 ENCOUNTER — Encounter: Payer: Self-pay | Admitting: *Deleted

## 2015-08-21 ENCOUNTER — Ambulatory Visit (INDEPENDENT_AMBULATORY_CARE_PROVIDER_SITE_OTHER): Payer: BC Managed Care – PPO | Admitting: Family Medicine

## 2015-08-21 VITALS — BP 130/80 | HR 85 | Temp 98.5°F | Ht 73.0 in | Wt 243.0 lb

## 2015-08-21 DIAGNOSIS — F988 Other specified behavioral and emotional disorders with onset usually occurring in childhood and adolescence: Secondary | ICD-10-CM

## 2015-08-21 DIAGNOSIS — F900 Attention-deficit hyperactivity disorder, predominantly inattentive type: Secondary | ICD-10-CM

## 2015-08-21 DIAGNOSIS — E039 Hypothyroidism, unspecified: Secondary | ICD-10-CM | POA: Diagnosis not present

## 2015-08-21 DIAGNOSIS — E785 Hyperlipidemia, unspecified: Secondary | ICD-10-CM | POA: Diagnosis not present

## 2015-08-21 DIAGNOSIS — G894 Chronic pain syndrome: Secondary | ICD-10-CM

## 2015-08-21 DIAGNOSIS — I10 Essential (primary) hypertension: Secondary | ICD-10-CM | POA: Diagnosis not present

## 2015-08-21 LAB — LIPID PANEL
CHOL/HDL RATIO: 6
Cholesterol: 231 mg/dL — ABNORMAL HIGH (ref 0–200)
HDL: 41.1 mg/dL (ref >39.00)
LDL CALC: 150 mg/dL — AB (ref 0–99)
NonHDL: 189.44
TRIGLYCERIDES: 198 mg/dL — AB (ref 0.0–149.0)
VLDL: 39.6 mg/dL (ref 0.0–40.0)

## 2015-08-21 LAB — TSH: TSH: 4.22 u[IU]/mL (ref 0.35–4.50)

## 2015-08-21 LAB — T4, FREE: FREE T4: 0.83 ng/dL (ref 0.60–1.60)

## 2015-08-21 MED ORDER — METHYLPHENIDATE HCL ER (OSM) 36 MG PO TBCR: 36.0000 mg | EXTENDED_RELEASE_TABLET | Freq: Every day | ORAL | Status: DC

## 2015-08-21 NOTE — Assessment & Plan Note (Signed)
Latest reading TSH elevated along with endorsed fatigue and noted mild increased anxiety. Check TFTs today and titrate meds accordingly

## 2015-08-21 NOTE — Assessment & Plan Note (Signed)
Appreciate care of Dr Hardin Negus

## 2015-08-21 NOTE — Assessment & Plan Note (Signed)
Chronic, stable. Continue current regimen. 

## 2015-08-21 NOTE — Patient Instructions (Addendum)
You are doing well today. Concerta refilled. Let us know when next you need refills on other medications. Pass by lab for controlled substance agreement today and further labwork.  Return for physical in 6 months, sooner if needed. Good to meet you today, call us with questions.

## 2015-08-21 NOTE — Assessment & Plan Note (Addendum)
Longstanding dx on concerta 36mg  daily - refilled today #90 to fill after 09/20/2015. Discussed possible summer holiday from medication.  Discussed pros and cons of controlled substances and expectations to receive prescription from our office. Patient is not to abuse, misuse, divert or use medication other than as prescribed. Patient is not to seek stimulant from other clinics or multiple pharmacies. Patient is not to use illegal drugs. Patient will establish or update controlled substance agreement and complete urine drug screen.

## 2015-08-21 NOTE — Assessment & Plan Note (Signed)
Continues lipitor 20mg  daily. Requests recheck FLP today as truly fasting.

## 2015-08-21 NOTE — Progress Notes (Signed)
BP 130/80 mmHg  Pulse 85  Temp(Src) 98.5 F (36.9 C)  Ht 6\' 1"  (1.854 m)  Wt 243 lb (110.224 kg)  BMI 32.07 kg/m2  SpO2 96%   CC: transfer care from Dr Anders Grant  Subjective:    Patient ID: Chase Burke, male    DOB: 1966/02/19, 50 y.o.   MRN: [id number]  HPI: Chase Burke is a 50 y.o. male presenting on 08/21/2015 for Establish Care and Fatigue   Last saw Dr Jenny Reichmann 01/2015 for CPE, prior saw Dr Sherren Mocha.  Planning road trip to [place] with son.  H/o severe reaction to flu shot last year - arm swelling, fever, malaise.   ADD - on concerta 36mg  daily. ADD diagnosed in grad school. Endorses symptoms since childhood. ?asperger syndrome. Takes daily - considering summer holiday.   Hypothyroidism - compliant with levothyroxine 39mcg daily. Hypothyroidism presented with panic attacks. More fatigued with malaise over last 4-5 months. Noticing increasing anxiety recently - claustrophobia related.   Pain management - sees Dr Nicholaus Bloom pain management. H/o cervical arthritis and DDD. On buprenorphine patch weekly.   HLD - on lipitor 20mg  daily for >10 years. Denies myalgias.   Relevant past medical, surgical, family and social history reviewed and updated as indicated. Interim medical history since our last visit reviewed. Allergies and medications reviewed and updated. Current Outpatient Prescriptions on File Prior to Visit  Medication Sig  . acyclovir (ZOVIRAX) 400 MG tablet Take 1 tablet (400 mg total) by mouth 3 (three) times daily. (Patient taking differently: Take 400 mg by mouth 3 (three) times daily as needed. )  . aspirin 325 MG tablet Take 325 mg by mouth daily.    . cetirizine (ZYRTEC) 10 MG tablet Take 10 mg by mouth daily.  . diphenhydrAMINE (BENADRYL) 25 MG tablet Take 25-50 mg by mouth at bedtime as needed for sleep.   . fluticasone (FLONASE) 50 MCG/ACT nasal spray Place 2 sprays into both nostrils daily.  Marland Kitchen levothyroxine (SYNTHROID, LEVOTHROID) 75 MCG tablet Take 1 tablet (75  mcg total) by mouth daily.  Marland Kitchen lisinopril (PRINIVIL,ZESTRIL) 5 MG tablet Take 1 tablet (5 mg total) by mouth daily.  . Melatonin 3 MG TABS Take 3 mg by mouth at bedtime.  . montelukast (SINGULAIR) 10 MG tablet Take 1 tablet (10 mg total) by mouth at bedtime. (Patient taking differently: Take 10 mg by mouth at bedtime. During spring)  . Multiple Vitamin (MULTIVITAMIN) tablet Take 1 tablet by mouth daily.  . naproxen (NAPROSYN) 250 MG tablet Take 250 mg by mouth 2 (two) times daily with a meal.    No current facility-administered medications on file prior to visit.    Review of Systems Per HPI unless specifically indicated in ROS section     Objective:    BP 130/80 mmHg  Pulse 85  Temp(Src) 98.5 F (36.9 C)  Ht 6\' 1"  (1.854 m)  Wt 243 lb (110.224 kg)  BMI 32.07 kg/m2  SpO2 96%  Wt Readings from Last 3 Encounters:  08/21/15 243 lb (110.224 kg)  01/19/15 236 lb (107.049 kg)  08/09/14 245 lb 1.3 oz (111.168 kg)    Physical Exam  Constitutional: He appears well-developed and well-nourished. No distress.  HENT:  Head: Normocephalic and atraumatic.  Mouth/Throat: Oropharynx is clear and moist. No oropharyngeal exudate.  Eyes: Conjunctivae and EOM are normal. Pupils are equal, round, and reactive to light.  Neck: Normal range of motion. Neck supple. No thyromegaly present.  Cardiovascular: Normal rate, regular rhythm, normal heart sounds  and intact distal pulses.   No murmur heard. Pulmonary/Chest: Effort normal and breath sounds normal. No respiratory distress. He has no wheezes. He has no rales.  Musculoskeletal: He exhibits no edema.  Lymphadenopathy:    He has no cervical adenopathy.  Skin: Skin is warm and dry. No rash noted.  Psychiatric: He has a normal mood and affect.  Nursing note and vitals reviewed.  Results for orders placed or performed in visit on 01/19/15  Lipid panel  Result Value Ref Range   Cholesterol 214 (H) 0 - 200 mg/dL   Triglycerides 231.0 (H) 0.0 -  149.0 mg/dL   HDL 46.10 >39.00 mg/dL   VLDL 46.2 (H) 0.0 - 40.0 mg/dL   Total CHOL/HDL Ratio 5    NonHDL [id number]   Basic metabolic panel  Result Value Ref Range   Sodium 139 135 - 145 mEq/L   Potassium 4.8 3.5 - 5.1 mEq/L   Chloride 100 96 - 112 mEq/L   CO2 31 19 - 32 mEq/L   Glucose, Bld 104 (H) 70 - 99 mg/dL   BUN 9 6 - 23 mg/dL   Creatinine, Ser 1.10 0.40 - 1.50 mg/dL   Calcium 10.4 8.4 - 10.5 mg/dL   GFR 76.21 >60.00 mL/min  Hepatic function panel  Result Value Ref Range   Total Bilirubin 0.8 0.2 - 1.2 mg/dL   Bilirubin, Direct 0.2 0.0 - 0.3 mg/dL   Alkaline Phosphatase 53 39 - 117 U/L   AST 22 0 - 37 U/L   ALT 26 0 - 53 U/L   Total Protein 7.3 6.0 - 8.3 g/dL   Albumin 4.5 3.5 - 5.2 g/dL  CBC with Differential/Platelet  Result Value Ref Range   WBC 6.0 4.0 - 10.5 K/uL   RBC 4.71 4.22 - 5.81 Mil/uL   Hemoglobin 15.1 13.0 - 17.0 g/dL   HCT 44.6 39.0 - 52.0 %   MCV 94.7 78.0 - 100.0 fl   MCHC 33.9 30.0 - 36.0 g/dL   RDW 13.3 11.5 - 15.5 %   Platelets 256.0 150.0 - 400.0 K/uL   Neutrophils Relative % 60.1 43.0 - 77.0 %   Lymphocytes Relative 29.8 12.0 - 46.0 %   Monocytes Relative 8.2 3.0 - 12.0 %   Eosinophils Relative 0.9 0.0 - 5.0 %   Basophils Relative 1.0 0.0 - 3.0 %   Neutro Abs 3.6 1.4 - 7.7 K/uL   Lymphs Abs 1.8 0.7 - 4.0 K/uL   Monocytes Absolute 0.5 0.1 - 1.0 K/uL   Eosinophils Absolute 0.1 0.0 - 0.7 K/uL   Basophils Absolute 0.1 0.0 - 0.1 K/uL  TSH  Result Value Ref Range   TSH 5.80 (H) 0.35 - 4.50 uIU/mL  Urinalysis, Routine w reflex microscopic (not at [facility])  Result Value Ref Range   Color, Urine YELLOW Yellow;Lt. Yellow   APPearance CLEAR Clear   Specific Gravity, Urine <=1.005 (A) 1.000 - 1.030   pH 7.0 5.0 - 8.0   Total Protein, Urine NEGATIVE Negative   Urine Glucose NEGATIVE Negative   Ketones, ur NEGATIVE Negative   Bilirubin Urine NEGATIVE Negative   Hgb urine dipstick NEGATIVE Negative   Urobilinogen, UA 0.2 0.0 - 1.0   Leukocytes, UA  NEGATIVE Negative   Nitrite NEGATIVE Negative   WBC, UA none seen 0-2/hpf   RBC / HPF 0-2/hpf 0-2/hpf  PSA  Result Value Ref Range   PSA 0.80 0.10 - 4.00 ng/mL  LDL cholesterol, direct  Result Value Ref Range   Direct  LDL 140.0 mg/dL      Assessment & Plan:   Problem List Items Addressed This Visit    Hyperlipidemia, acquired    Continues lipitor 20mg  daily. Requests recheck FLP today as truly fasting.       Relevant Orders   Lipid panel   Hypothyroidism    Latest reading TSH elevated along with endorsed fatigue and noted mild increased anxiety. Check TFTs today and titrate meds accordingly      Relevant Orders   TSH   T3   T4, free   Essential hypertension, benign    Chronic, stable. Continue current regimen.       Relevant Medications   atorvastatin (LIPITOR) 20 MG tablet   Chronic pain syndrome    Appreciate care of Dr Hardin Negus      Attention deficit disorder without hyperactivity - Primary    Longstanding dx on concerta 36mg  daily - refilled today #90 to fill after 09/20/2015. Discussed possible summer holiday from medication.  Discussed pros and cons of controlled substances and expectations to receive prescription from our office. Patient is not to abuse, misuse, divert or use medication other than as prescribed. Patient is not to seek stimulant from other clinics or multiple pharmacies. Patient is not to use illegal drugs. Patient will establish or update controlled substance agreement and complete urine drug screen.        Relevant Medications   methylphenidate (CONCERTA) 36 MG PO CR tablet       Follow up plan: Return in about 6 months (around 02/20/2016), or as needed, for annual exam, prior fasting for blood work.  Ria Bush, MD

## 2015-08-22 LAB — T3: T3, Total: 107 ng/dL (ref 76–181)

## 2015-09-03 ENCOUNTER — Telehealth: Payer: Self-pay | Admitting: *Deleted

## 2015-09-03 NOTE — Telephone Encounter (Signed)
Ok to fill early. Thanks.

## 2015-09-03 NOTE — Telephone Encounter (Signed)
Stephanie at [facility] left a voicemail stating that they have a script for patient's Concerta to fill after [id number] written by Dr. Danise Mina.. Patient is leaving to go out of the country to [place] 09/15/15 and will not be able to get it filled there. [place] wants to know if they can get an okay to fill this early since patient is going out of the country?

## 2015-09-04 NOTE — Telephone Encounter (Signed)
Stephanie notified and I okayed to fill on 09/13/15 to give him time to have everything in order.

## 2015-09-06 ENCOUNTER — Encounter: Payer: Self-pay | Admitting: Family Medicine

## 2015-12-08 ENCOUNTER — Other Ambulatory Visit: Payer: Self-pay | Admitting: Orthopedic Surgery

## 2015-12-08 DIAGNOSIS — M25512 Pain in left shoulder: Secondary | ICD-10-CM

## 2015-12-17 ENCOUNTER — Other Ambulatory Visit: Payer: Self-pay | Admitting: Orthopedic Surgery

## 2015-12-17 DIAGNOSIS — Z77018 Contact with and (suspected) exposure to other hazardous metals: Secondary | ICD-10-CM

## 2015-12-24 ENCOUNTER — Other Ambulatory Visit: Payer: Self-pay | Admitting: Family Medicine

## 2015-12-24 DIAGNOSIS — F988 Other specified behavioral and emotional disorders with onset usually occurring in childhood and adolescence: Secondary | ICD-10-CM

## 2015-12-24 MED ORDER — METHYLPHENIDATE HCL ER (OSM) 36 MG PO TBCR: 36.0000 mg | EXTENDED_RELEASE_TABLET | Freq: Every day | ORAL | 0 refills | Status: DC

## 2015-12-24 NOTE — Telephone Encounter (Signed)
Ok to refill? Last filled 08/21/15 # 90 0RF with instructions to fill after 09/20/15.

## 2015-12-24 NOTE — Telephone Encounter (Signed)
Printed and in [facility] box 

## 2015-12-25 NOTE — Telephone Encounter (Signed)
Message left notifying patient and Rx placed up front for pick up. 

## 2016-01-01 ENCOUNTER — Ambulatory Visit
Admission: RE | Admit: 2016-01-01 | Discharge: 2016-01-01 | Disposition: A | Discharge status: Home / Self Care | Payer: BC Managed Care – PPO | Source: Ambulatory Visit | Attending: Orthopedic Surgery | Admitting: Orthopedic Surgery

## 2016-01-01 DIAGNOSIS — M25512 Pain in left shoulder: Secondary | ICD-10-CM

## 2016-01-01 DIAGNOSIS — Z77018 Contact with and (suspected) exposure to other hazardous metals: Secondary | ICD-10-CM

## 2016-01-01 MED ORDER — IOPAMIDOL (ISOVUE-M 200) INJECTION 41%
12.0000 mL | Freq: Once | INTRAMUSCULAR | Status: AC
  Administered 2016-01-01: 12 mL via INTRA_ARTICULAR

## 2016-02-04 ENCOUNTER — Other Ambulatory Visit: Payer: Self-pay | Admitting: Internal Medicine

## 2016-02-04 DIAGNOSIS — E785 Hyperlipidemia, unspecified: Secondary | ICD-10-CM

## 2016-02-04 DIAGNOSIS — E783 Hyperchylomicronemia: Secondary | ICD-10-CM

## 2016-02-05 ENCOUNTER — Ambulatory Visit (INDEPENDENT_AMBULATORY_CARE_PROVIDER_SITE_OTHER): Payer: BC Managed Care – PPO | Admitting: Family Medicine

## 2016-02-05 ENCOUNTER — Encounter: Payer: Self-pay | Admitting: Family Medicine

## 2016-02-05 VITALS — BP 120/80 | HR 94 | Ht 73.0 in | Wt 235.0 lb

## 2016-02-05 DIAGNOSIS — I10 Essential (primary) hypertension: Secondary | ICD-10-CM

## 2016-02-05 DIAGNOSIS — M961 Postlaminectomy syndrome, not elsewhere classified: Secondary | ICD-10-CM

## 2016-02-05 DIAGNOSIS — G894 Chronic pain syndrome: Secondary | ICD-10-CM

## 2016-02-05 DIAGNOSIS — Z0000 Encounter for general adult medical examination without abnormal findings: Secondary | ICD-10-CM

## 2016-02-05 DIAGNOSIS — E785 Hyperlipidemia, unspecified: Secondary | ICD-10-CM

## 2016-02-05 DIAGNOSIS — F988 Other specified behavioral and emotional disorders with onset usually occurring in childhood and adolescence: Secondary | ICD-10-CM

## 2016-02-05 DIAGNOSIS — E039 Hypothyroidism, unspecified: Secondary | ICD-10-CM | POA: Diagnosis not present

## 2016-02-05 DIAGNOSIS — E669 Obesity, unspecified: Secondary | ICD-10-CM

## 2016-02-05 LAB — LIPID PANEL
CHOL/HDL RATIO: 4
Cholesterol: 234 mg/dL — ABNORMAL HIGH (ref 0–200)
HDL: 52.4 mg/dL (ref >39.00)
LDL CALC: 146 mg/dL — AB (ref 0–99)
NonHDL: 181.32
TRIGLYCERIDES: 177 mg/dL — AB (ref 0.0–149.0)
VLDL: 35.4 mg/dL (ref 0.0–40.0)

## 2016-02-05 LAB — COMPREHENSIVE METABOLIC PANEL
ALT: 29 U/L (ref 0–53)
AST: 26 U/L (ref 0–37)
Albumin: 4.7 g/dL (ref 3.5–5.2)
Alkaline Phosphatase: 52 U/L (ref 39–117)
BUN: 10 mg/dL (ref 6–23)
CALCIUM: 10.3 mg/dL (ref 8.4–10.5)
CHLORIDE: 101 meq/L (ref 96–112)
CO2: 30 meq/L (ref 19–32)
CREATININE: 1.05 mg/dL (ref 0.40–1.50)
GFR: 80.06 mL/min (ref >60.00)
Glucose, Bld: 108 mg/dL — ABNORMAL HIGH (ref 70–99)
Potassium: 5 mEq/L (ref 3.5–5.1)
Sodium: 139 mEq/L (ref 135–145)
Total Bilirubin: 0.6 mg/dL (ref 0.2–1.2)
Total Protein: 7.7 g/dL (ref 6.0–8.3)

## 2016-02-05 LAB — TSH: TSH: 4.99 u[IU]/mL — ABNORMAL HIGH (ref 0.35–4.50)

## 2016-02-05 MED ORDER — ATORVASTATIN CALCIUM 20 MG PO TABS: 20.0000 mg | ORAL_TABLET | Freq: Every day | ORAL | 3 refills | Status: DC

## 2016-02-05 MED ORDER — LEVOTHYROXINE SODIUM 75 MCG PO TABS: 75.0000 ug | ORAL_TABLET | Freq: Every day | ORAL | 3 refills | Status: DC

## 2016-02-05 MED ORDER — LISINOPRIL 5 MG PO TABS: 5.0000 mg | ORAL_TABLET | Freq: Every day | ORAL | 3 refills | Status: DC

## 2016-02-05 NOTE — Assessment & Plan Note (Signed)
UTD UDS and controlled substance agreement (Q000111Q) Concerta effective, tolerating well. Will continue.

## 2016-02-05 NOTE — Assessment & Plan Note (Addendum)
Discussing possible RFA with Dr Hardin Negus.

## 2016-02-05 NOTE — Assessment & Plan Note (Signed)
Preventative protocols reviewed and updated unless pt declined. Discussed healthy diet and lifestyle.  

## 2016-02-05 NOTE — Assessment & Plan Note (Signed)
Appreciate pain clinic management

## 2016-02-05 NOTE — Assessment & Plan Note (Signed)
Chronic, stable. Continue current regimen. 

## 2016-02-05 NOTE — Assessment & Plan Note (Addendum)
Chronic, stable. Continue levothyroxine. Update TSH

## 2016-02-05 NOTE — Assessment & Plan Note (Signed)
Chronic, stable. Continue lipitor. Update FLP.

## 2016-02-05 NOTE — Progress Notes (Signed)
BP 120/80   Pulse 94   Ht 6\' 1"  (1.854 m)   Wt 235 lb (106.6 kg)   SpO2 98%   BMI 31.00 kg/m    CC: CPE Subjective:    Patient ID: Chase Burke, male    DOB: 1965-04-28, 50 y.o.   MRN: [id number]  HPI: Chase Burke is a 50 y.o. male presenting on 02/05/2016 for Annual Exam (refused flu shot )  ADD - on concerta 36mg  daily. Denies chest pain, palpitations, insomnia, appetite changes, headaches.  Pain management - sees Dr Nicholaus Bloom pain management. H/o cervical arthritis and DDD. On buprenorphine patch weekly.   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 Considering RFA of cervical neck. Followed by Dr Onnie Graham ortho  Preventative: Tdap 2014 zostavax 2014 Seat belt use discussed Sunscreens use discussed, no changing moles on skin Ex-smoker (2009) Alcohol - 2-3 beers daily  Lives with wife and son, no pets  Edu: PhD  Occ: Economy Professor at [employer]  Activity: yardwork. Walking some on treadmill Diet: good water, fruits/vegetables daily  Relevant past medical, surgical, family and social history reviewed and updated as indicated. Interim medical history since our last visit reviewed. Allergies and medications reviewed and updated. Current Outpatient Prescriptions on File Prior to Visit  Medication Sig  . Buprenorphine (BUTRANS) 7.5 MCG/HR PTWK Place 1 patch onto the skin once a week.  . cetirizine (ZYRTEC) 10 MG tablet Take 10 mg by mouth daily.  . diphenhydrAMINE (BENADRYL) 25 MG tablet Take 25-50 mg by mouth at bedtime as needed for sleep.   . fluticasone (FLONASE) 50 MCG/ACT nasal spray Place 2 sprays into both nostrils daily.  Donnie Aho (GLUCOSAMINE MSM COMPLEX PO) Take 1 tablet by mouth daily.  . Melatonin 3 MG TABS Take 3 mg by mouth at bedtime.  . methylphenidate (CONCERTA) 36 MG PO CR tablet Take 1 tablet (36 mg total) by mouth daily.  . Multiple  Vitamin (MULTIVITAMIN) tablet Take 1 tablet by mouth daily.  . naproxen (NAPROSYN) 250 MG tablet Take 250 mg by mouth 2 (two) times daily with a meal.    No current facility-administered medications on file prior to visit.     Review of Systems  Constitutional: Negative for activity change, appetite change, chills, fatigue, fever and unexpected weight change.  HENT: Negative for hearing loss.   Eyes: Negative for visual disturbance.  Respiratory: Negative for cough, chest tightness, shortness of breath and wheezing.   Cardiovascular: Negative for chest pain, palpitations and leg swelling.  Gastrointestinal: Negative for abdominal distention, abdominal pain, blood in stool, constipation, diarrhea, nausea and vomiting.  Genitourinary: Negative for difficulty urinating and hematuria.  Musculoskeletal: Negative for arthralgias, myalgias and neck pain.  Skin: Negative for rash.  Neurological: Negative for dizziness, seizures, syncope and headaches.  Hematological: Negative for adenopathy. Does not bruise/bleed easily.  Psychiatric/Behavioral: Negative for dysphoric mood. The patient is not nervous/anxious.    Per HPI unless specifically indicated in ROS section     Objective:    BP 120/80   Pulse 94   Ht 6\' 1"  (1.854 m)   Wt 235 lb (106.6 kg)   SpO2 98%   BMI 31.00 kg/m   Wt Readings from Last 3 Encounters:  02/05/16 235 lb (106.6 kg)  08/21/15 243 lb (110.2 kg)  01/19/15 236 lb (107 kg)    Physical Exam  Constitutional: He is oriented to person, place, and time. He appears  well-developed and well-nourished. No distress.  HENT:  Head: Normocephalic and atraumatic.  Right Ear: Hearing, tympanic membrane, external ear and ear canal normal.  Left Ear: Hearing, tympanic membrane, external ear and ear canal normal.  Nose: Nose normal.  Mouth/Throat: Uvula is midline, oropharynx is clear and moist and mucous membranes are normal. No oropharyngeal exudate, posterior oropharyngeal  edema or posterior oropharyngeal erythema.  Eyes: Conjunctivae and EOM are normal. Pupils are equal, round, and reactive to light. No scleral icterus.  Neck: Normal range of motion. Neck supple. No thyromegaly present.  Cardiovascular: Normal rate, regular rhythm, normal heart sounds and intact distal pulses.   No murmur heard. Pulses:      Radial pulses are 2+ on the right side, and 2+ on the left side.  Pulmonary/Chest: Effort normal and breath sounds normal. No respiratory distress. He has no wheezes. He has no rales.  Abdominal: Soft. Bowel sounds are normal. He exhibits no distension and no mass. There is no tenderness. There is no rebound and no guarding.  Musculoskeletal: Normal range of motion. He exhibits no edema.  Lymphadenopathy:    He has no cervical adenopathy.  Neurological: He is alert and oriented to person, place, and time.  CN grossly intact, station and gait intact  Skin: Skin is warm and dry. No rash noted.  Psychiatric: He has a normal mood and affect. His behavior is normal. Judgment and thought content normal.  Nursing note and vitals reviewed.  Results for orders placed or performed in visit on 08/21/15  Lipid panel  Result Value Ref Range   Cholesterol 231 (H) 0 - 200 mg/dL   Triglycerides 198.0 (H) 0.0 - 149.0 mg/dL   HDL 41.10 >39.00 mg/dL   VLDL 39.6 0.0 - 40.0 mg/dL   LDL Cholesterol 150 (H) 0 - 99 mg/dL   Total CHOL/HDL Ratio 6    NonHDL 189.44   TSH  Result Value Ref Range   TSH 4.22 0.35 - 4.50 uIU/mL  T3  Result Value Ref Range   T3, Total 107.0 76 - 181 ng/dL  T4, free  Result Value Ref Range   Free T4 0.83 0.60 - 1.60 ng/dL      Assessment & Plan:   Problem List Items Addressed This Visit    Attention deficit disorder without hyperactivity    UTD UDS and controlled substance agreement (Q000111Q) Concerta effective, tolerating well. Will continue.       Cervical post-laminectomy syndrome    Discussing possible RFA with Dr Hardin Negus.         Chronic pain syndrome    Appreciate pain clinic management       Essential hypertension, benign    Chronic, stable. Continue current regimen.      Relevant Medications   aspirin EC 81 MG tablet   atorvastatin (LIPITOR) 20 MG tablet   lisinopril (PRINIVIL,ZESTRIL) 5 MG tablet   Health maintenance examination - Primary    Preventative protocols reviewed and updated unless pt declined. Discussed healthy diet and lifestyle.       Hyperlipidemia    Chronic, stable. Continue lipitor. Update FLP.       Relevant Medications   aspirin EC 81 MG tablet   atorvastatin (LIPITOR) 20 MG tablet   lisinopril (PRINIVIL,ZESTRIL) 5 MG tablet   Other Relevant Orders   Lipid panel   Comprehensive metabolic panel   Hypothyroidism    Chronic, stable. Continue levothyroxine. Update TSH      Relevant Medications   levothyroxine (SYNTHROID, LEVOTHROID) 75  MCG tablet   Other Relevant Orders   TSH   Obesity, Class I, BMI 30.0-34.9 (see actual BMI)    Discussed healthy diet and lifestyle changes to affect sustainable weight loss. 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          Follow up plan: Return in about 1 year (around 02/04/2017) for annual exam, prior fasting for blood work.  Ria Bush, MD

## 2016-02-05 NOTE — Assessment & Plan Note (Signed)
Discussed healthy diet and lifestyle changes to affect sustainable weight loss. 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

## 2016-02-05 NOTE — Patient Instructions (Signed)
Labs today You are doing well today. Return as needed or in 1 year for next physical.  Health Maintenance, Male A healthy lifestyle and preventative care can promote health and wellness.  Maintain regular health, dental, and eye exams.  Eat a healthy diet. Foods like vegetables, fruits, whole grains, low-fat dairy products, and lean protein foods contain the nutrients you need and are low in calories. Decrease your intake of foods high in solid fats, added sugars, and salt. Get information about a proper diet from your health care provider, if necessary.  Regular physical exercise is one of the most important things you can do for your health. Most adults should get at least 150 minutes of moderate-intensity exercise (any activity that increases your heart rate and causes you to sweat) each week. In addition, most adults need muscle-strengthening exercises on 2 or more days a week.   Maintain a healthy weight. The body mass index (BMI) is a screening tool to identify possible weight problems. It provides an estimate of body fat based on height and weight. Your health care provider can find your BMI and can help you achieve or maintain a healthy weight. For males 20 years and older:  A BMI below 18.5 is considered underweight.  A BMI of 18.5 to 24.9 is normal.  A BMI of 25 to 29.9 is considered overweight.  A BMI of 30 and above is considered obese.  Maintain normal blood lipids and cholesterol by exercising and minimizing your intake of saturated fat. Eat a balanced diet with plenty of fruits and vegetables. Blood tests for lipids and cholesterol should begin at age 42 and be repeated every 5 years. If your lipid or cholesterol levels are high, you are over age 87, or you are at high risk for heart disease, you may need your cholesterol levels checked more frequently.Ongoing high lipid and cholesterol levels should be treated with medicines if diet and exercise are not working.  If you  smoke, find out from your health care provider how to quit. If you do not use tobacco, do not start.  Lung cancer screening is recommended for adults aged 45-80 years who are at high risk for developing lung cancer because of a history of smoking. A yearly low-dose CT scan of the lungs is recommended for people who have at least a 30-pack-year history of smoking and are current smokers or have quit within the past 15 years. A pack year of smoking is smoking an average of 1 pack of cigarettes a day for 1 year (for example, a 30-pack-year history of smoking could mean smoking 1 pack a day for 30 years or 2 packs a day for 15 years). Yearly screening should continue until the smoker has stopped smoking for at least 15 years. Yearly screening should be stopped for people who develop a health problem that would prevent them from having lung cancer treatment.  If you choose to drink alcohol, do not have more than 2 drinks per day. One drink is considered to be 12 oz (360 mL) of beer, 5 oz (150 mL) of wine, or 1.5 oz (45 mL) of liquor.  Avoid the use of street drugs. Do not share needles with anyone. Ask for help if you need support or instructions about stopping the use of drugs.  High blood pressure causes heart disease and increases the risk of stroke. High blood pressure is more likely to develop in:  People who have blood pressure in the end of the normal  range (100-139/85-89 mm Hg).  People who are overweight or obese.  People who are African American.  If you are 40-13 years of age, have your blood pressure checked every 3-5 years. If you are 10 years of age or older, have your blood pressure checked every year. You should have your blood pressure measured twice-once when you are at a hospital or clinic, and once when you are not at a hospital or clinic. Record the average of the two measurements. To check your blood pressure when you are not at a hospital or clinic, you can use:  An automated  blood pressure machine at a pharmacy.  A home blood pressure monitor.  If you are 4-57 years old, ask your health care provider if you should take aspirin to prevent heart disease.  Diabetes screening involves taking a blood sample to check your fasting blood sugar level. This should be done once every 3 years after age 80 if you are at a normal weight and without risk factors for diabetes. Testing should be considered at a younger age or be carried out more frequently if you are overweight and have at least 1 risk factor for diabetes.  Colorectal cancer can be detected and often prevented. Most routine colorectal cancer screening begins at the age of 22 and continues through age 65. However, your health care provider may recommend screening at an earlier age if you have risk factors for colon cancer. On a yearly basis, your health care provider may provide home test kits to check for hidden blood in the stool. A small camera at the end of a tube may be used to directly examine the colon (sigmoidoscopy or colonoscopy) to detect the earliest forms of colorectal cancer. Talk to your health care provider about this at age 102 when routine screening begins. A direct exam of the colon should be repeated every 5-10 years through age 37, unless early forms of precancerous polyps or small growths are found.  People who are at an increased risk for hepatitis B should be screened for this virus. You are considered at high risk for hepatitis B if:  You were born in a country where hepatitis B occurs often. Talk with your health care provider about which countries are considered high risk.  Your parents were born in a high-risk country and you have not received a shot to protect against hepatitis B (hepatitis B vaccine).  You have HIV or AIDS.  You use needles to inject street drugs.  You live with, or have sex with, someone who has hepatitis B.  You are a man who has sex with other men (MSM).  You get  hemodialysis treatment.  You take certain medicines for conditions like cancer, organ transplantation, and autoimmune conditions.  Hepatitis C blood testing is recommended for all people born from [birth year] through 1963 and any individual with known risk factors for hepatitis C.  Healthy men should no longer receive prostate-specific antigen (PSA) blood tests as part of routine cancer screening. Talk to your health care provider about prostate cancer screening.  Testicular cancer screening is not recommended for adolescents or adult males who have no symptoms. Screening includes self-exam, a health care provider exam, and other screening tests. Consult with your health care provider about any symptoms you have or any concerns you have about testicular cancer.  Practice safe sex. Use condoms and avoid high-risk sexual practices to reduce the spread of sexually transmitted infections (STIs).  You should be screened for STIs,  including gonorrhea and chlamydia if:  You are sexually active and are younger than 24 years.  You are older than 24 years, and your health care provider tells you that you are at risk for this type of infection.  Your sexual activity has changed since you were last screened, and you are at an increased risk for chlamydia or gonorrhea. Ask your health care provider if you are at risk.  If you are at risk of being infected with HIV, it is recommended that you take a prescription medicine daily to prevent HIV infection. This is called pre-exposure prophylaxis (PrEP). You are considered at risk if:  You are a man who has sex with other men (MSM).  You are a heterosexual man who is sexually active with multiple partners.  You take drugs by injection.  You are sexually active with a partner who has HIV.  Talk with your health care provider about whether you are at high risk of being infected with HIV. If you choose to begin PrEP, you should first be tested for HIV. You should  then be tested every 3 months for as long as you are taking PrEP.  Use sunscreen. Apply sunscreen liberally and repeatedly throughout the day. You should seek shade when your shadow is shorter than you. Protect yourself by wearing long sleeves, pants, a wide-brimmed hat, and sunglasses year round whenever you are outdoors.  Tell your health care provider of new moles or changes in moles, especially if there is a change in shape or color. Also, tell your health care provider if a mole is larger than the size of a pencil eraser.  A one-time screening for abdominal aortic aneurysm (AAA) and surgical repair of large AAAs by ultrasound is recommended for men aged 69-75 years who are current or former smokers.  Stay current with your vaccines (immunizations). This information is not intended to replace advice given to you by your health care provider. Make sure you discuss any questions you have with your health care provider. Document Released: 08/30/2007 Document Revised: 03/24/2014 Document Reviewed: 12/05/2014 Elsevier Interactive Patient Education  2017 [employer].

## 2016-02-09 ENCOUNTER — Other Ambulatory Visit: Payer: Self-pay | Admitting: Family Medicine

## 2016-02-09 DIAGNOSIS — E039 Hypothyroidism, unspecified: Secondary | ICD-10-CM

## 2016-02-09 MED ORDER — LEVOTHYROXINE SODIUM 88 MCG PO TABS: 88.0000 ug | ORAL_TABLET | Freq: Every day | ORAL | 3 refills | Status: DC

## 2016-02-12 ENCOUNTER — Encounter: Payer: Self-pay | Admitting: Family Medicine

## 2016-02-12 DIAGNOSIS — E039 Hypothyroidism, unspecified: Secondary | ICD-10-CM

## 2016-03-18 ENCOUNTER — Other Ambulatory Visit: Payer: Self-pay

## 2016-03-18 DIAGNOSIS — F988 Other specified behavioral and emotional disorders with onset usually occurring in childhood and adolescence: Secondary | ICD-10-CM

## 2016-03-18 MED ORDER — METHYLPHENIDATE HCL ER (OSM) 36 MG PO TBCR: 36.0000 mg | EXTENDED_RELEASE_TABLET | Freq: Every day | ORAL | 0 refills | Status: DC

## 2016-03-18 NOTE — Telephone Encounter (Signed)
Received a fax fromt the pt asking for a 90 day supply of Methylphenidate. Last written 12-24-15 #90 Last OV 02-05-16 No Future OV

## 2016-03-18 NOTE — Telephone Encounter (Signed)
Printed and in [facility] box 

## 2016-03-19 NOTE — Telephone Encounter (Signed)
Message left advising patient and Rx placed up front for pick up. 

## 2016-06-17 ENCOUNTER — Other Ambulatory Visit: Payer: Self-pay | Admitting: *Deleted

## 2016-06-17 DIAGNOSIS — F988 Other specified behavioral and emotional disorders with onset usually occurring in childhood and adolescence: Secondary | ICD-10-CM

## 2016-06-17 NOTE — Telephone Encounter (Signed)
Received fax requesting refill of med, CPE was done 02/05/16, last filled on 03/18/16 #90 with 0 tabs

## 2016-06-18 MED ORDER — METHYLPHENIDATE HCL ER (OSM) 36 MG PO TBCR: 36.0000 mg | EXTENDED_RELEASE_TABLET | Freq: Every day | ORAL | 0 refills | Status: DC

## 2016-06-18 NOTE — Telephone Encounter (Signed)
Printed and in [facility] box 

## 2016-06-18 NOTE — Telephone Encounter (Signed)
Spoke to pt and informed him Rx is available for pickup from the front desk 

## 2016-09-19 ENCOUNTER — Telehealth: Payer: Self-pay | Admitting: Family Medicine

## 2016-09-19 ENCOUNTER — Other Ambulatory Visit: Payer: Self-pay | Admitting: Family Medicine

## 2016-09-19 DIAGNOSIS — F988 Other specified behavioral and emotional disorders with onset usually occurring in childhood and adolescence: Secondary | ICD-10-CM

## 2016-09-19 MED ORDER — METHYLPHENIDATE HCL ER (OSM) 36 MG PO TBCR: 36.0000 mg | EXTENDED_RELEASE_TABLET | Freq: Every day | ORAL | 0 refills | Status: DC

## 2016-09-19 NOTE — Telephone Encounter (Signed)
Message left for pt on home # as authorized in [place] that rx is ready to pick up. Rx ledt at the front desk.

## 2016-09-19 NOTE — Telephone Encounter (Signed)
Received faxed request from pt for concerta. Printed and in [place] box.

## 2016-12-22 ENCOUNTER — Other Ambulatory Visit: Payer: Self-pay | Admitting: Family Medicine

## 2016-12-22 ENCOUNTER — Other Ambulatory Visit: Payer: Self-pay

## 2016-12-22 DIAGNOSIS — F988 Other specified behavioral and emotional disorders with onset usually occurring in childhood and adolescence: Secondary | ICD-10-CM

## 2016-12-22 NOTE — Telephone Encounter (Signed)
Last printed: 09/19/16, #90 Last OV (CPE):  02/05/16 Next OV:  none

## 2016-12-23 MED ORDER — METHYLPHENIDATE HCL ER (OSM) 36 MG PO TBCR: 36.0000 mg | EXTENDED_RELEASE_TABLET | Freq: Every day | ORAL | 0 refills | Status: DC

## 2016-12-23 NOTE — Telephone Encounter (Signed)
Printed and in [facility] box 

## 2016-12-23 NOTE — Telephone Encounter (Signed)
Spoke with pt notifying him his rx is ready to pick up. Placed rx at front office.

## 2017-01-13 ENCOUNTER — Encounter: Payer: Self-pay | Admitting: Family Medicine

## 2017-02-02 ENCOUNTER — Other Ambulatory Visit: Payer: Self-pay | Admitting: Family Medicine

## 2017-02-02 ENCOUNTER — Other Ambulatory Visit (INDEPENDENT_AMBULATORY_CARE_PROVIDER_SITE_OTHER): Payer: BC Managed Care – PPO

## 2017-02-02 ENCOUNTER — Encounter: Payer: Self-pay | Admitting: Family Medicine

## 2017-02-02 DIAGNOSIS — E039 Hypothyroidism, unspecified: Secondary | ICD-10-CM

## 2017-02-02 DIAGNOSIS — E785 Hyperlipidemia, unspecified: Secondary | ICD-10-CM | POA: Diagnosis not present

## 2017-02-02 LAB — LIPID PANEL
Cholesterol: 215 mg/dL — ABNORMAL HIGH (ref 0–200)
HDL: 35.7 mg/dL — AB (ref >39.00)
NonHDL: 179.25
Total CHOL/HDL Ratio: 6
Triglycerides: 338 mg/dL — ABNORMAL HIGH (ref 0.0–149.0)
VLDL: 67.6 mg/dL — AB (ref 0.0–40.0)

## 2017-02-02 LAB — TSH: TSH: 14.86 u[IU]/mL — AB (ref 0.35–4.50)

## 2017-02-02 LAB — BASIC METABOLIC PANEL
BUN: 10 mg/dL (ref 6–23)
CHLORIDE: 101 meq/L (ref 96–112)
CO2: 33 meq/L — AB (ref 19–32)
Calcium: 9.6 mg/dL (ref 8.4–10.5)
Creatinine, Ser: 1.03 mg/dL (ref 0.40–1.50)
GFR: 81.52 mL/min (ref >60.00)
GLUCOSE: 90 mg/dL (ref 70–99)
POTASSIUM: 4.8 meq/L (ref 3.5–5.1)
Sodium: 138 mEq/L (ref 135–145)

## 2017-02-02 LAB — T4, FREE: FREE T4: 0.86 ng/dL (ref 0.60–1.60)

## 2017-02-02 LAB — LDL CHOLESTEROL, DIRECT: Direct LDL: 118 mg/dL

## 2017-02-03 ENCOUNTER — Other Ambulatory Visit: Payer: Self-pay | Admitting: Family Medicine

## 2017-02-03 DIAGNOSIS — E039 Hypothyroidism, unspecified: Secondary | ICD-10-CM

## 2017-02-03 MED ORDER — LEVOTHYROXINE SODIUM 100 MCG PO TABS
100.0000 ug | ORAL_TABLET | Freq: Every day | ORAL | 3 refills | Status: DC
Start: 2017-02-03 — End: 2017-03-21

## 2017-02-09 ENCOUNTER — Ambulatory Visit (INDEPENDENT_AMBULATORY_CARE_PROVIDER_SITE_OTHER): Payer: BC Managed Care – PPO | Admitting: Family Medicine

## 2017-02-09 ENCOUNTER — Encounter: Payer: Self-pay | Admitting: Family Medicine

## 2017-02-09 VITALS — BP 120/80 | HR 91 | Temp 98.4°F | Ht 72.0 in | Wt 247.5 lb

## 2017-02-09 DIAGNOSIS — I10 Essential (primary) hypertension: Secondary | ICD-10-CM | POA: Diagnosis not present

## 2017-02-09 DIAGNOSIS — E669 Obesity, unspecified: Secondary | ICD-10-CM | POA: Diagnosis not present

## 2017-02-09 DIAGNOSIS — E785 Hyperlipidemia, unspecified: Secondary | ICD-10-CM

## 2017-02-09 DIAGNOSIS — M25511 Pain in right shoulder: Secondary | ICD-10-CM

## 2017-02-09 DIAGNOSIS — F988 Other specified behavioral and emotional disorders with onset usually occurring in childhood and adolescence: Secondary | ICD-10-CM

## 2017-02-09 DIAGNOSIS — Z0000 Encounter for general adult medical examination without abnormal findings: Secondary | ICD-10-CM

## 2017-02-09 DIAGNOSIS — E039 Hypothyroidism, unspecified: Secondary | ICD-10-CM

## 2017-02-09 DIAGNOSIS — Z0283 Encounter for blood-alcohol and blood-drug test: Secondary | ICD-10-CM

## 2017-02-09 DIAGNOSIS — G894 Chronic pain syndrome: Secondary | ICD-10-CM

## 2017-02-09 NOTE — Assessment & Plan Note (Signed)
Update UDS today. Continue concerta - tolerating well.

## 2017-02-09 NOTE — Patient Instructions (Addendum)
Schedule lab visit for early January to recheck thyroid. We will also recheck cholesterol at that time.  Update UDS today.  Return in 1 year for next physical.   Health Maintenance, Male A healthy lifestyle and preventive care is important for your health and wellness. Ask your health care provider about what schedule of regular examinations is right for you. What should I know about weight and diet? Eat a Healthy Diet  Eat plenty of vegetables, fruits, whole grains, low-fat dairy products, and lean protein.  Do not eat a lot of foods high in solid fats, added sugars, or salt.  Maintain a Healthy Weight Regular exercise can help you achieve or maintain a healthy weight. You should:  Do at least 150 minutes of exercise each week. The exercise should increase your heart rate and make you sweat (moderate-intensity exercise).  Do strength-training exercises at least twice a week.  Watch Your Levels of Cholesterol and Blood Lipids  Have your blood tested for lipids and cholesterol every 5 years starting at 49 years of age. If you are at high risk for heart disease, you should start having your blood tested when you are 49 years old. You may need to have your cholesterol levels checked more often if: ? Your lipid or cholesterol levels are high. ? You are older than 49 years of age. ? You are at high risk for heart disease.  What should I know about cancer screening? Many types of cancers can be detected early and may often be prevented. Lung Cancer  You should be screened every year for lung cancer if: ? You are a current smoker who has smoked for at least 30 years. ? You are a former smoker who has quit within the past 15 years.  Talk to your health care provider about your screening options, when you should start screening, and how often you should be screened.  Colorectal Cancer  Routine colorectal cancer screening usually begins at 49 years of age and should be repeated every  5-10 years until you are 49 years old. You may need to be screened more often if early forms of precancerous polyps or small growths are found. Your health care provider may recommend screening at an earlier age if you have risk factors for colon cancer.  Your health care provider may recommend using home test kits to check for hidden blood in the stool.  A small camera at the end of a tube can be used to examine your colon (sigmoidoscopy or colonoscopy). This checks for the earliest forms of colorectal cancer.  Prostate and Testicular Cancer  Depending on your age and overall health, your health care provider may do certain tests to screen for prostate and testicular cancer.  Talk to your health care provider about any symptoms or concerns you have about testicular or prostate cancer.  Skin Cancer  Check your skin from head to toe regularly.  Tell your health care provider about any new moles or changes in moles, especially if: ? There is a change in a mole's size, shape, or color. ? You have a mole that is larger than a pencil eraser.  Always use sunscreen. Apply sunscreen liberally and repeat throughout the day.  Protect yourself by wearing long sleeves, pants, a wide-brimmed hat, and sunglasses when outside.  What should I know about heart disease, diabetes, and high blood pressure?  If you are 4-71 years of age, have your blood pressure checked every 3-5 years. If you are 40  years of age or older, have your blood pressure checked every year. You should have your blood pressure measured twice-once when you are at a hospital or clinic, and once when you are not at a hospital or clinic. Record the average of the two measurements. To check your blood pressure when you are not at a hospital or clinic, you can use: ? An automated blood pressure machine at a pharmacy. ? A home blood pressure monitor.  Talk to your health care provider about your target blood pressure.  If you are  between 43-55 years old, ask your health care provider if you should take aspirin to prevent heart disease.  Have regular diabetes screenings by checking your fasting blood sugar level. ? If you are at a normal weight and have a low risk for diabetes, have this test once every three years after the age of 73. ? If you are overweight and have a high risk for diabetes, consider being tested at a younger age or more often.  A one-time screening for abdominal aortic aneurysm (AAA) by ultrasound is recommended for men aged 74-75 years who are current or former smokers. What should I know about preventing infection? Hepatitis B If you have a higher risk for hepatitis B, you should be screened for this virus. Talk with your health care provider to find out if you are at risk for hepatitis B infection. Hepatitis C Blood testing is recommended for:  Everyone born from [birth year] through 1963.  Anyone with known risk factors for hepatitis C.  Sexually Transmitted Diseases (STDs)  You should be screened each year for STDs including gonorrhea and chlamydia if: ? You are sexually active and are younger than 49 years of age. ? You are older than 49 years of age and your health care provider tells you that you are at risk for this type of infection. ? Your sexual activity has changed since you were last screened and you are at an increased risk for chlamydia or gonorrhea. Ask your health care provider if you are at risk.  Talk with your health care provider about whether you are at high risk of being infected with HIV. Your health care provider may recommend a prescription medicine to help prevent HIV infection.  What else can I do?  Schedule regular health, dental, and eye exams.  Stay current with your vaccines (immunizations).  Do not use any tobacco products, such as cigarettes, chewing tobacco, and e-cigarettes. If you need help quitting, ask your health care provider.  Limit alcohol intake to no  more than 2 drinks per day. One drink equals 12 ounces of beer, 5 ounces of wine, or 1 ounces of hard liquor.  Do not use street drugs.  Do not share needles.  Ask your health care provider for help if you need support or information about quitting drugs.  Tell your health care provider if you often feel depressed.  Tell your health care provider if you have ever been abused or do not feel safe at home. This information is not intended to replace advice given to you by your health care provider. Make sure you discuss any questions you have with your health care provider. Document Released: 08/30/2007 Document Revised: 10/31/2015 Document Reviewed: 12/05/2014 Elsevier Interactive Patient Education  [employer].

## 2017-02-09 NOTE — Progress Notes (Signed)
BP 120/80 (BP Location: Left Arm, Patient Position: Sitting, Cuff Size: Large)   Pulse 91   Temp 98.4 F (36.9 C) (Oral)   Ht 6' (1.829 m)   Wt 247 lb 8 oz (112.3 kg)   SpO2 98%   BMI 33.57 kg/m    CC: CPE Subjective:    Patient ID: Chase Burke, male    DOB: 09-07-1965, 51 y.o.   MRN: [id number]  HPI: Chase Burke is a 51 y.o. male presenting on 02/09/2017 for Annual Exam   Increased fatigue noted in June 2018. Lack of energy. Weight gain noted.   ADD - on concerta 36mg  daily. Denies chest pain, palpitations, insomnia, appetite changes, headaches. Update UDS today.   Pain management - sees Dr Nicholaus Bloom pain management. H/o cervical arthritis and DDD. On buprenorphine patch weekly.   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 S/p 2 shoulder surgeries in the past. Considering RFA of cervical neck. Followed by Dr Onnie Graham ortho and Dr Hardin Negus.   Preventative: Flu shot - declines  Tdap 2014 zostavax 2014  Seat belt use discussed  Sunscreens use discussed, no changing moles on skin  Ex-smoker (2009) Alcohol - 2 beers daily  Lives with wife and son, no pets  Edu: PhD  Occ: Economy Professor at [employer]  Activity: yardwork. Walking some on treadmill Diet: good water, fruits/vegetables daily   Relevant past medical, surgical, family and social history reviewed and updated as indicated. Interim medical history since our last visit reviewed. Allergies and medications reviewed and updated. Outpatient Medications Prior to Visit  Medication Sig Dispense Refill  . aspirin EC 81 MG tablet Take 81 mg by mouth daily.    Marland Kitchen atorvastatin (LIPITOR) 20 MG tablet Take 1 tablet (20 mg total) by mouth daily. 100 tablet 3  . Buprenorphine (BUTRANS) 7.5 MCG/HR PTWK Place 1 patch onto the skin once a week.    . cetirizine (ZYRTEC) 10 MG tablet Take 10 mg by mouth daily.    . diphenhydrAMINE  (BENADRYL) 25 MG tablet Take 25-50 mg by mouth at bedtime as needed for sleep.     . fluticasone (FLONASE) 50 MCG/ACT nasal spray Place 2 sprays into both nostrils daily. 16 g 6  . Glucos-MSM-C-Mn-Ginger-Willow (GLUCOSAMINE MSM COMPLEX PO) Take 1 tablet by mouth daily.    Marland Kitchen levothyroxine (SYNTHROID, LEVOTHROID) 100 MCG tablet Take 1 tablet (100 mcg total) by mouth daily. 100 tablet 3  . lisinopril (PRINIVIL,ZESTRIL) 5 MG tablet Take 1 tablet (5 mg total) by mouth daily. 100 tablet 3  . Melatonin 3 MG TABS Take 3 mg by mouth at bedtime.    . methylphenidate (CONCERTA) 36 MG PO CR tablet Take 1 tablet (36 mg total) by mouth daily. 90 tablet 0  . Multiple Vitamin (MULTIVITAMIN) tablet Take 1 tablet by mouth daily.    . naproxen (NAPROSYN) 250 MG tablet Take 250 mg by mouth 2 (two) times daily with a meal.      No facility-administered medications prior to visit.      Per HPI unless specifically indicated in ROS section below Review of Systems  Constitutional: Negative for activity change, appetite change, chills, fatigue, fever and unexpected weight change.  HENT: Negative for hearing loss.   Eyes: Negative for visual disturbance.  Respiratory: Positive for cough. Negative for chest tightness, shortness of breath and wheezing.   Cardiovascular: Negative for chest pain, palpitations and leg swelling.  Gastrointestinal: Negative  for abdominal distention, abdominal pain, blood in stool, constipation, diarrhea, nausea and vomiting.  Genitourinary: Negative for difficulty urinating and hematuria.  Musculoskeletal: Negative for arthralgias, myalgias and neck pain.  Skin: Negative for rash.  Neurological: Negative for dizziness, seizures, syncope and headaches.  Hematological: Negative for adenopathy. Does not bruise/bleed easily.  Psychiatric/Behavioral: Negative for dysphoric mood. The patient is not nervous/anxious.        Objective:    BP 120/80 (BP Location: Left Arm, Patient Position:  Sitting, Cuff Size: Large)   Pulse 91   Temp 98.4 F (36.9 C) (Oral)   Ht 6' (1.829 m)   Wt 247 lb 8 oz (112.3 kg)   SpO2 98%   BMI 33.57 kg/m   Wt Readings from Last 3 Encounters:  02/09/17 247 lb 8 oz (112.3 kg)  02/05/16 235 lb (106.6 kg)  08/21/15 243 lb (110.2 kg)    Physical Exam  Constitutional: He is oriented to person, place, and time. He appears well-developed and well-nourished. No distress.  HENT:  Head: Normocephalic and atraumatic.  Right Ear: Hearing, tympanic membrane, external ear and ear canal normal.  Left Ear: Hearing, tympanic membrane, external ear and ear canal normal.  Nose: Nose normal.  Mouth/Throat: Uvula is midline, oropharynx is clear and moist and mucous membranes are normal. No oropharyngeal exudate, posterior oropharyngeal edema or posterior oropharyngeal erythema.  Eyes: Conjunctivae and EOM are normal. Pupils are equal, round, and reactive to light. No scleral icterus.  Neck: Normal range of motion. Neck supple. No thyromegaly present.  Cardiovascular: Normal rate, regular rhythm, normal heart sounds and intact distal pulses.  No murmur heard. Pulses:      Radial pulses are 2+ on the right side, and 2+ on the left side.  Pulmonary/Chest: Effort normal and breath sounds normal. No respiratory distress. He has no wheezes. He has no rales.  Abdominal: Soft. Bowel sounds are normal. He exhibits no distension and no mass. There is no tenderness. There is no rebound and no guarding.  Musculoskeletal: Normal range of motion. He exhibits no edema.  Lymphadenopathy:    He has no cervical adenopathy.  Neurological: He is alert and oriented to person, place, and time.  CN grossly intact, station and gait intact  Skin: Skin is warm and dry. No rash noted.  Psychiatric: He has a normal mood and affect. His behavior is normal. Judgment and thought content normal.  Nursing note and vitals reviewed.  Results for orders placed or performed in visit on 16/10/96    Basic metabolic panel  Result Value Ref Range   Sodium 138 135 - 145 mEq/L   Potassium 4.8 3.5 - 5.1 mEq/L   Chloride 101 96 - 112 mEq/L   CO2 33 (H) 19 - 32 mEq/L   Glucose, Bld 90 70 - 99 mg/dL   BUN 10 6 - 23 mg/dL   Creatinine, Ser 1.03 0.40 - 1.50 mg/dL   Calcium 9.6 8.4 - 10.5 mg/dL   GFR 81.52 >60.00 mL/min  TSH  Result Value Ref Range   TSH 14.86 (H) 0.35 - 4.50 uIU/mL  Lipid panel  Result Value Ref Range   Cholesterol 215 (H) 0 - 200 mg/dL   Triglycerides 338.0 (H) 0.0 - 149.0 mg/dL   HDL 35.70 (L) >39.00 mg/dL   VLDL 67.6 (H) 0.0 - 40.0 mg/dL   Total CHOL/HDL Ratio 6    NonHDL 179.25   T4, free  Result Value Ref Range   Free T4 0.86 0.60 - 1.60 ng/dL  LDL cholesterol, direct  Result Value Ref Range   Direct LDL 118.0 mg/dL      Assessment & Plan:   Problem List Items Addressed This Visit    Attention deficit disorder without hyperactivity    Update UDS today. Continue concerta - tolerating well.       Chronic pain syndrome    Pain contract with Dr Hardin Negus.       Essential hypertension, benign    Chronic, stable. Continue current regimen.       Health maintenance examination - Primary    Preventative protocols reviewed and updated unless pt declined. Discussed healthy diet and lifestyle.       Hyperlipidemia    Chronic, deteriorated. ?hypothyroid related. Recheck when returns for TFTs.       Hypothyroidism    Chronic, we have increased levothyroxine to 165mcg daily. RTC 4-6 wks lab visit to recheck TFTs.       Obesity, Class I, BMI 30.0-34.9 (see actual BMI)    Reviewed healthy diet and lifestyle changes to affect sustainable weight loss       SHOULDER PAIN, RIGHT, CHRONIC    Appreciate ortho and pain management care of patient.           Follow up plan: Return in about 1 year (around 02/09/2018) for annual exam, prior fasting for blood work.  Ria Bush, MD

## 2017-02-09 NOTE — Assessment & Plan Note (Signed)
Chronic, we have increased levothyroxine to 142mcg daily. RTC 4-6 wks lab visit to recheck TFTs.

## 2017-02-09 NOTE — Assessment & Plan Note (Signed)
Preventative protocols reviewed and updated unless pt declined. Discussed healthy diet and lifestyle.  

## 2017-02-09 NOTE — Assessment & Plan Note (Signed)
Pain contract with Dr Hardin Negus.

## 2017-02-09 NOTE — Assessment & Plan Note (Signed)
Chronic, stable. Continue current regimen. 

## 2017-02-09 NOTE — Assessment & Plan Note (Signed)
Reviewed healthy diet and lifestyle changes to affect sustainable weight loss.  

## 2017-02-09 NOTE — Assessment & Plan Note (Signed)
Chronic, deteriorated. ?hypothyroid related. Recheck when returns for TFTs.

## 2017-02-09 NOTE — Addendum Note (Signed)
Addended by: Ellamae Sia on: 02/09/2017 12:23 PM   Modules accepted: Orders

## 2017-02-09 NOTE — Assessment & Plan Note (Signed)
Appreciate ortho and pain management care of patient.

## 2017-02-10 LAB — PAIN MGMT, PROFILE 8 W/CONF, U
6 ACETYLMORPHINE: NEGATIVE ng/mL (ref <10)
ALCOHOL METABOLITES: NEGATIVE ng/mL (ref <500)
Amphetamines: NEGATIVE ng/mL (ref <500)
Benzodiazepines: NEGATIVE ng/mL (ref <100)
Buprenorphine, Urine: NEGATIVE ng/mL (ref <5)
COCAINE METABOLITE: NEGATIVE ng/mL (ref <150)
Creatinine: 41.2 mg/dL
MARIJUANA METABOLITE: NEGATIVE ng/mL (ref <20)
MDMA: NEGATIVE ng/mL (ref <500)
OPIATES: NEGATIVE ng/mL (ref <100)
Oxidant: NEGATIVE ug/mL (ref <200)
Oxycodone: NEGATIVE ng/mL (ref <100)
pH: 6.67 (ref 4.5–9.0)

## 2017-02-15 ENCOUNTER — Encounter: Payer: Self-pay | Admitting: Family Medicine

## 2017-02-16 ENCOUNTER — Other Ambulatory Visit (INDEPENDENT_AMBULATORY_CARE_PROVIDER_SITE_OTHER): Payer: BC Managed Care – PPO

## 2017-02-16 ENCOUNTER — Telehealth: Payer: Self-pay | Admitting: Family Medicine

## 2017-02-16 DIAGNOSIS — Z2839 Other underimmunization status: Secondary | ICD-10-CM

## 2017-02-16 DIAGNOSIS — Z283 Underimmunization status: Secondary | ICD-10-CM

## 2017-02-16 NOTE — Telephone Encounter (Signed)
Pt walks in today needing to complete forms for work ([employer]) - incomplete immunization records. Will draw MMR and varicella titers.

## 2017-02-17 LAB — VARICELLA ZOSTER ANTIBODY, IGG: VARICELLA IGG: 3717 {index}

## 2017-02-17 LAB — RUBEOLA ANTIBODY IGG: RUBEOLA IGG: 260 [AU]/ml

## 2017-02-17 LAB — RUBELLA SCREEN: Rubella: 2.27 index

## 2017-02-17 LAB — MUMPS ANTIBODY, IGG: Mumps IgG: 51 AU/mL

## 2017-02-17 NOTE — Telephone Encounter (Signed)
Noted.  Yes and letter printed and sent with patient stating immunizations up to date pending MMR titer results.

## 2017-03-09 ENCOUNTER — Other Ambulatory Visit: Payer: Self-pay | Admitting: Family Medicine

## 2017-03-09 ENCOUNTER — Telehealth: Payer: BC Managed Care – PPO | Admitting: Family

## 2017-03-09 DIAGNOSIS — I10 Essential (primary) hypertension: Secondary | ICD-10-CM

## 2017-03-09 DIAGNOSIS — B9689 Other specified bacterial agents as the cause of diseases classified elsewhere: Secondary | ICD-10-CM

## 2017-03-09 DIAGNOSIS — J208 Acute bronchitis due to other specified organisms: Secondary | ICD-10-CM

## 2017-03-09 MED ORDER — DOXYCYCLINE HYCLATE 100 MG PO TABS: 100.0000 mg | ORAL_TABLET | Freq: Two times a day (BID) | ORAL | 0 refills | Status: DC

## 2017-03-09 MED ORDER — BENZONATATE 100 MG PO CAPS: 100.0000 mg | ORAL_CAPSULE | Freq: Three times a day (TID) | ORAL | 0 refills | Status: DC | PRN

## 2017-03-09 NOTE — Progress Notes (Signed)
We are sorry that you are not feeling well.  Here is how we plan to help!  Based on your presentation I believe you most likely have A cough due to bacteria.  When patients have a fever and a productive cough with a change in color or increased sputum production, we are concerned about bacterial bronchitis.  If left untreated it can progress to pneumonia.  If your symptoms do not improve with your treatment plan it is important that you contact your provider.   I have prescribed Doxycycline 100 mg twice a day for 7 days     In addition you may use A non-prescription cough medication called Robitussin DAC. Take 2 teaspoons every 8 hours or Delsym: take 2 teaspoons every 12 hours., A non-prescription cough medication called Mucinex DM: take 2 tablets every 12 hours. and A prescription cough medication called Tessalon Perles 100mg. You may take 1-2 capsules every 8 hours as needed for your cough.    From your responses in the eVisit questionnaire you describe inflammation in the upper respiratory tract which is causing a significant cough.  This is commonly called Bronchitis and has four common causes:    Allergies  Viral Infections  Acid Reflux  Bacterial Infection Allergies, viruses and acid reflux are treated by controlling symptoms or eliminating the cause. An example might be a cough caused by taking certain blood pressure medications. You stop the cough by changing the medication. Another example might be a cough caused by acid reflux. Controlling the reflux helps control the cough.  USE OF BRONCHODILATOR ("RESCUE") INHALERS: There is a risk from using your bronchodilator too frequently.  The risk is that over-reliance on a medication which only relaxes the muscles surrounding the breathing tubes can reduce the effectiveness of medications prescribed to reduce swelling and congestion of the tubes themselves.  Although you feel brief relief from the bronchodilator inhaler, your asthma may  actually be worsening with the tubes becoming more swollen and filled with mucus.  This can delay other crucial treatments, such as oral steroid medications. If you need to use a bronchodilator inhaler daily, several times per day, you should discuss this with your provider.  There are probably better treatments that could be used to keep your asthma under control.     HOME CARE . Only take medications as instructed by your medical team. . Complete the entire course of an antibiotic. . Drink plenty of fluids and get plenty of rest. . Avoid close contacts especially the very young and the elderly . Cover your mouth if you cough or cough into your sleeve. . Always remember to wash your hands . A steam or ultrasonic humidifier can help congestion.   GET HELP RIGHT AWAY IF: . You develop worsening fever. . You become short of breath . You cough up blood. . Your symptoms persist after you have completed your treatment plan MAKE SURE YOU   Understand these instructions.  Will watch your condition.  Will get help right away if you are not doing well or get worse.  Your e-visit answers were reviewed by a board certified advanced clinical practitioner to complete your personal care plan.  Depending on the condition, your plan could have included both over the counter or prescription medications. If there is a problem please reply  once you have received a response from your provider. Your safety is important to us.  If you have drug allergies check your prescription carefully.    You can use   MyChart to ask questions about today's visit, request a non-urgent call back, or ask for a work or school excuse for 24 hours related to this e-Visit. If it has been greater than 24 hours you will need to follow up with your provider, or enter a new e-Visit to address those concerns. You will get an e-mail in the next two days asking about your experience.  I hope that your e-visit has been valuable and will  speed your recovery. Thank you for using e-visits.   

## 2017-03-15 ENCOUNTER — Other Ambulatory Visit: Payer: Self-pay | Admitting: Family Medicine

## 2017-03-15 DIAGNOSIS — E785 Hyperlipidemia, unspecified: Secondary | ICD-10-CM

## 2017-03-15 DIAGNOSIS — E039 Hypothyroidism, unspecified: Secondary | ICD-10-CM

## 2017-03-18 ENCOUNTER — Other Ambulatory Visit (INDEPENDENT_AMBULATORY_CARE_PROVIDER_SITE_OTHER): Payer: BC Managed Care – PPO

## 2017-03-18 DIAGNOSIS — E039 Hypothyroidism, unspecified: Secondary | ICD-10-CM

## 2017-03-18 DIAGNOSIS — E785 Hyperlipidemia, unspecified: Secondary | ICD-10-CM

## 2017-03-18 LAB — LIPID PANEL
Cholesterol: 198 mg/dL (ref 0–200)
HDL: 32 mg/dL — ABNORMAL LOW (ref >39.00)
NonHDL: 165.66
TRIGLYCERIDES: 273 mg/dL — AB (ref 0.0–149.0)
Total CHOL/HDL Ratio: 6
VLDL: 54.6 mg/dL — ABNORMAL HIGH (ref 0.0–40.0)

## 2017-03-18 LAB — LDL CHOLESTEROL, DIRECT: LDL DIRECT: 124 mg/dL

## 2017-03-18 LAB — TSH: TSH: 8.78 u[IU]/mL — AB (ref 0.35–4.50)

## 2017-03-18 LAB — T4, FREE: Free T4: 0.79 ng/dL (ref 0.60–1.60)

## 2017-03-19 ENCOUNTER — Other Ambulatory Visit: Payer: Self-pay | Admitting: Family Medicine

## 2017-03-19 ENCOUNTER — Encounter: Payer: Self-pay | Admitting: Family Medicine

## 2017-03-19 DIAGNOSIS — F988 Other specified behavioral and emotional disorders with onset usually occurring in childhood and adolescence: Secondary | ICD-10-CM

## 2017-03-20 ENCOUNTER — Telehealth: Payer: Self-pay

## 2017-03-20 DIAGNOSIS — E785 Hyperlipidemia, unspecified: Secondary | ICD-10-CM

## 2017-03-20 MED ORDER — ATORVASTATIN CALCIUM 20 MG PO TABS: 20.0000 mg | ORAL_TABLET | Freq: Every day | ORAL | 3 refills | Status: DC

## 2017-03-20 MED ORDER — METHYLPHENIDATE HCL ER (OSM) 36 MG PO TBCR: 36.0000 mg | EXTENDED_RELEASE_TABLET | Freq: Every day | ORAL | 0 refills | Status: DC

## 2017-03-20 NOTE — Telephone Encounter (Signed)
Rx sent to pharmacy   

## 2017-03-20 NOTE — Telephone Encounter (Signed)
Last printed:  12/23/16, #90 Last OV (CPE):  02/09/17 Next OV:  none

## 2017-03-20 NOTE — Telephone Encounter (Signed)
Sent electronically 

## 2017-03-21 ENCOUNTER — Other Ambulatory Visit: Payer: Self-pay | Admitting: Family Medicine

## 2017-03-21 DIAGNOSIS — E785 Hyperlipidemia, unspecified: Secondary | ICD-10-CM

## 2017-03-21 DIAGNOSIS — E039 Hypothyroidism, unspecified: Secondary | ICD-10-CM

## 2017-03-21 MED ORDER — LEVOTHYROXINE SODIUM 112 MCG PO TABS: 112.0000 ug | ORAL_TABLET | Freq: Every day | ORAL | 3 refills | Status: DC

## 2017-03-21 MED ORDER — ATORVASTATIN CALCIUM 40 MG PO TABS: 40.0000 mg | ORAL_TABLET | Freq: Every day | ORAL | 3 refills | Status: DC

## 2017-03-23 NOTE — Telephone Encounter (Signed)
Pt was notified.  

## 2017-06-17 ENCOUNTER — Other Ambulatory Visit: Payer: Self-pay | Admitting: Family Medicine

## 2017-06-17 DIAGNOSIS — F988 Other specified behavioral and emotional disorders with onset usually occurring in childhood and adolescence: Secondary | ICD-10-CM

## 2017-06-18 NOTE — Telephone Encounter (Signed)
Eprescribed.

## 2017-06-18 NOTE — Telephone Encounter (Signed)
Last filled:  03/20/17, #90 Last OV (CPE):  02/09/17 Next OV: none

## 2017-06-26 ENCOUNTER — Other Ambulatory Visit (INDEPENDENT_AMBULATORY_CARE_PROVIDER_SITE_OTHER): Payer: BC Managed Care – PPO

## 2017-06-26 ENCOUNTER — Encounter: Payer: Self-pay | Admitting: Family Medicine

## 2017-06-26 DIAGNOSIS — E039 Hypothyroidism, unspecified: Secondary | ICD-10-CM

## 2017-06-26 DIAGNOSIS — E785 Hyperlipidemia, unspecified: Secondary | ICD-10-CM | POA: Diagnosis not present

## 2017-06-26 LAB — TSH: TSH: 3.85 u[IU]/mL (ref 0.35–4.50)

## 2017-06-26 LAB — LIPID PANEL
CHOLESTEROL: 188 mg/dL (ref 0–200)
HDL: 43.7 mg/dL (ref >39.00)
LDL Cholesterol: 118 mg/dL — ABNORMAL HIGH (ref 0–99)
NonHDL: 143.91
TRIGLYCERIDES: 129 mg/dL (ref 0.0–149.0)
Total CHOL/HDL Ratio: 4
VLDL: 25.8 mg/dL (ref 0.0–40.0)

## 2017-06-26 LAB — T4, FREE: FREE T4: 0.93 ng/dL (ref 0.60–1.60)

## 2017-07-01 MED ORDER — TYPHOID VACCINE PO CPDR: 1.0000 | DELAYED_RELEASE_CAPSULE | ORAL | 0 refills | Status: DC

## 2017-07-15 ENCOUNTER — Ambulatory Visit (INDEPENDENT_AMBULATORY_CARE_PROVIDER_SITE_OTHER): Payer: BC Managed Care – PPO

## 2017-07-15 DIAGNOSIS — Z23 Encounter for immunization: Secondary | ICD-10-CM | POA: Diagnosis not present

## 2017-07-16 ENCOUNTER — Other Ambulatory Visit: Payer: Self-pay | Admitting: Family Medicine

## 2017-07-16 DIAGNOSIS — E039 Hypothyroidism, unspecified: Secondary | ICD-10-CM

## 2017-08-22 ENCOUNTER — Encounter: Payer: Self-pay | Admitting: Nurse Practitioner

## 2017-08-22 ENCOUNTER — Ambulatory Visit: Payer: Self-pay | Admitting: Nurse Practitioner

## 2017-08-22 VITALS — BP 126/84 | HR 75 | Temp 98.4°F | Resp 18 | Wt 239.6 lb

## 2017-08-22 DIAGNOSIS — J029 Acute pharyngitis, unspecified: Secondary | ICD-10-CM

## 2017-08-22 LAB — POCT RAPID STREP A (OFFICE): RAPID STREP A SCREEN: NEGATIVE

## 2017-08-22 MED ORDER — AMOXICILLIN 875 MG PO TABS: 875.0000 mg | ORAL_TABLET | Freq: Two times a day (BID) | ORAL | 0 refills | Status: AC

## 2017-08-22 NOTE — Progress Notes (Addendum)
Subjective:     Chase Burke is a 52 y.o. male who presents for evaluation of sore throat. Associated symptoms include nasal blockage, post nasal drip, sinus and nasal congestion and sore throat. Onset of symptoms was 3 days ago, and have been gradually worsening since that time. He is drinking plenty of fluids. He has had a recent close exposure to someone with proven streptococcal pharyngitis.  The following portions of the patient's history were reviewed and updated as appropriate: allergies, current medications and past medical history.  Review of Systems Constitutional: negative Eyes: negative Ears, nose, mouth, throat, and face: positive for nasal congestion, sore throat and ear fullness/pressure, negative for ear drainage, hoarseness and voice change Respiratory: negative Cardiovascular: negative Gastrointestinal: negative Neurological: negative Allergic/Immunologic: positive for hay fever    Objective:    BP 126/84 (BP Location: Right Arm, Patient Position: Sitting, Cuff Size: Normal)   Pulse 75   Temp 98.4 F (36.9 C) (Oral)   Resp 18   Wt 239 lb 9.6 oz (108.7 kg)   SpO2 98%   BMI 32.50 kg/m  General appearance: alert, cooperative and no distress Head: Normocephalic, without obvious abnormality, atraumatic Eyes: conjunctivae/corneas clear. PERRL, EOM's intact. Fundi benign. Ears: normal TM's and external ear canals both ears Nose: Nares normal. Septum midline. Mucosa normal. No drainage or sinus tenderness. Throat: abnormal findings: mild oropharyngeal erythema Lungs: clear to auscultation bilaterally Heart: regular rate and rhythm, S1, S2 normal, no murmur, click, rub or gallop Abdomen: soft, non-tender; bowel sounds normal; no masses,  no organomegaly Pulses: 2+ and symmetric Skin: Skin color, texture, turgor normal. No rashes or lesions Lymph nodes: mild cervical adenopathy bilaterally Neurologic: Grossly normal  Laboratory Strep test done. Results:negative.     Assessment:    Acute pharyngitis, likely  Viral pharyngitis.    Plan:    Use of OTC analgesics recommended as well as salt water gargles. Patient advised of the risk of peritonsillar abscess formation. Discussed with patient options that he may have since he has been exposed to strep.  Patient would like to attempt symptomatic treatment at this time.  Encouraged warm salt water gargles, use of Tylenol or ibuprofen for pain, cough drops, and honey.  Informed patient that he should be improving in the next 5 to 6 days.  Patient requested a prescription if his symptoms worsen.  I do not object to this as the patient has had a known exposure to strep.  Will print prescription for amoxicillin 875 mg twice a day for 10 days if needed.  Patient education provided.  Patient verbalizes understanding and has no questions at time of discharge.   Meds ordered this encounter  Medications  . amoxicillin (AMOXIL) 875 MG tablet    Sig: Take 1 tablet (875 mg total) by mouth 2 (two) times daily for 10 days.    Dispense:  20 tablet    Refill:  0    Order Specific Question:   Supervising Provider    Answer:   Ricard Dillon [phone]

## 2017-08-22 NOTE — Patient Instructions (Signed)

## 2017-09-14 ENCOUNTER — Other Ambulatory Visit: Payer: Self-pay | Admitting: Family Medicine

## 2017-09-14 DIAGNOSIS — F988 Other specified behavioral and emotional disorders with onset usually occurring in childhood and adolescence: Secondary | ICD-10-CM

## 2017-09-14 NOTE — Telephone Encounter (Signed)
Name of Medication: Methylphenidate Name of Pharmacy: [facility] or Written Date and Quantity: 06/18/17, #90 Last Office Visit and Type: 02/09/17, CPE Next Office Visit and Type: none Last Controlled Substance Agreement Date: 08/21/15 Last UDS: 08/21/15

## 2017-09-16 NOTE — Telephone Encounter (Signed)
Eprescribed.

## 2017-11-18 ENCOUNTER — Other Ambulatory Visit: Payer: Self-pay | Admitting: Family Medicine

## 2017-11-18 DIAGNOSIS — E039 Hypothyroidism, unspecified: Secondary | ICD-10-CM

## 2017-12-14 ENCOUNTER — Other Ambulatory Visit: Payer: Self-pay | Admitting: Family Medicine

## 2017-12-14 DIAGNOSIS — F988 Other specified behavioral and emotional disorders with onset usually occurring in childhood and adolescence: Secondary | ICD-10-CM

## 2017-12-14 NOTE — Telephone Encounter (Signed)
Name of Medication: Methylphenidate Name of Pharmacy: [facility] or Written Date and Quantity: 09/16/17, #90 Last Office Visit and Type: 02/09/17, CPE Next Office Visit and Type: none Last Controlled Substance Agreement Date: 02/09/17 Last UDS: 02/09/17

## 2017-12-16 NOTE — Telephone Encounter (Signed)
Eprescribed.

## 2018-01-12 ENCOUNTER — Telehealth: Payer: Self-pay

## 2018-01-12 NOTE — Telephone Encounter (Signed)
Left message for patient to call back and reschedule nurse visit for Hepatitis A #2 from 01/13/2018 to sometime after November 1st. Thank Edrick Kins, RMA

## 2018-01-13 ENCOUNTER — Ambulatory Visit: Payer: BC Managed Care – PPO

## 2018-02-10 ENCOUNTER — Other Ambulatory Visit: Payer: Self-pay | Admitting: Family Medicine

## 2018-02-10 DIAGNOSIS — E039 Hypothyroidism, unspecified: Secondary | ICD-10-CM

## 2018-02-20 IMAGING — MR MR SHOULDER*L* W/ CM
6 series · 40 of 40 positions shown · IV contrast (agent unspecified)
Comparison: Injection image dated 01/01/2016

CLINICAL DATA: Chronic left shoulder pain with numbness and
weakness. History of labral repair.

EXAM:
MR ARTHROGRAM OF THE LEFT SHOULDER
TECHNIQUE: Multiplanar, multisequence MR imaging of the left shoulder was
performed following the administration of intra-articular contrast.
CONTRAST:  See Injection Documentation.

[Series 4: T2 fat-sat · coronal · 4.0mm · 0.55mm/px · 9 of 19 slices shown (1 of 2)]
[im 1/19]
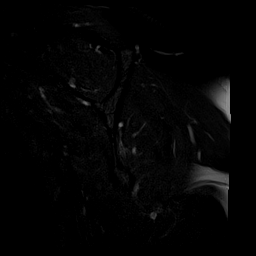
[im 3/19]
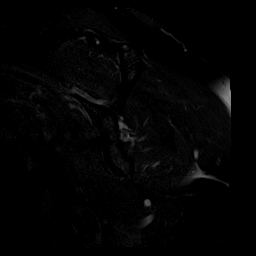
[im 5/19]
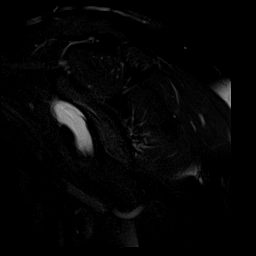
[im 7/19]
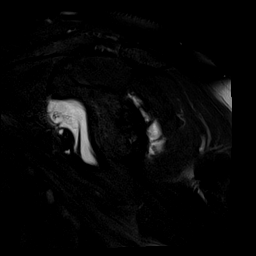
[im 10/19]
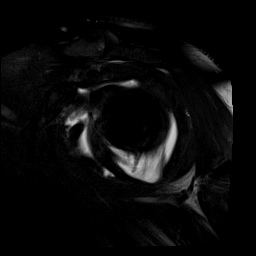
[im 12/19]
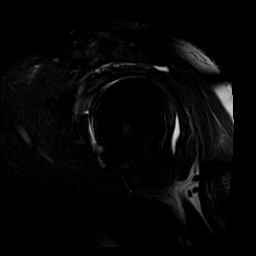
[im 14/19]
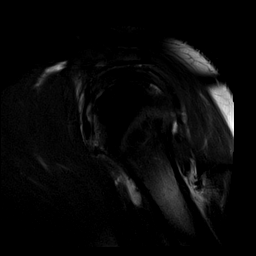
[im 16/19]
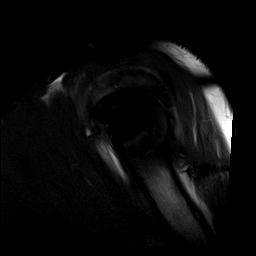
[im 19/19]
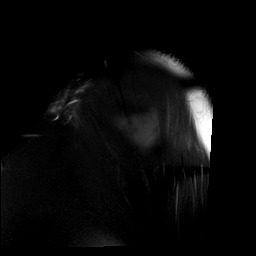

[Series 5: T1 fat-sat · oblique · 4.0mm · 0.44mm/px · 6 of 16 slices shown (1 of 4)]
[im 1/16]
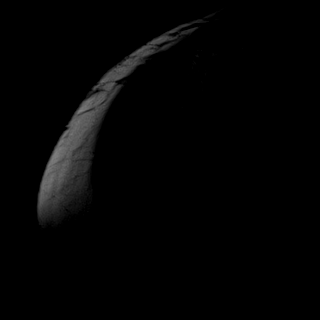
[im 4/16]
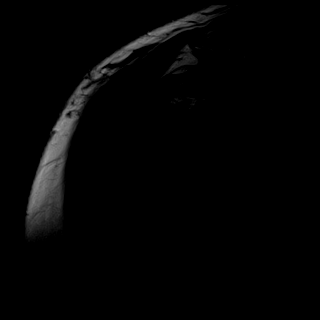
[im 7/16]
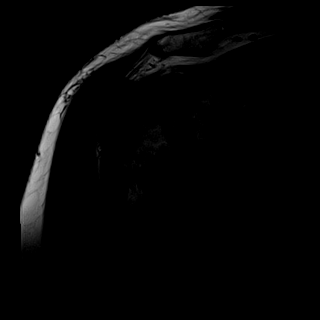
[im 10/16]
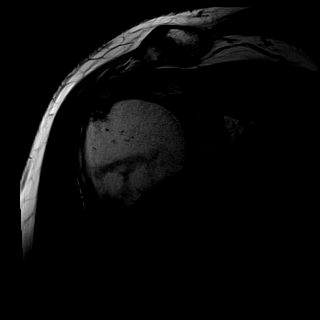
[im 13/16]
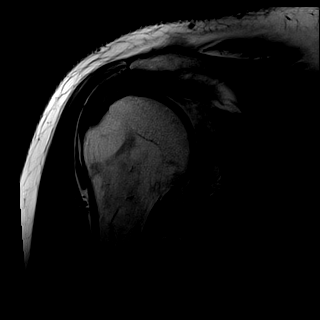
[im 16/16]
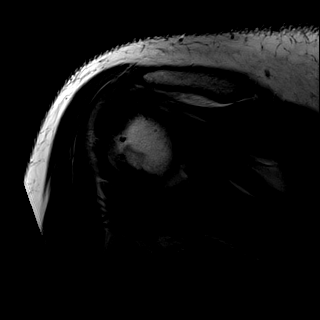

[Series 6: T1 fat-sat · oblique · 4.0mm · 0.55mm/px · 6 of 16 slices shown (2 of 4)]
[im 1/16]
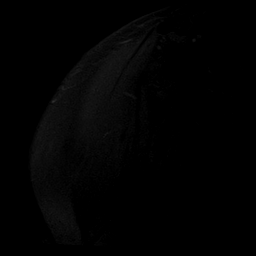
[im 4/16]
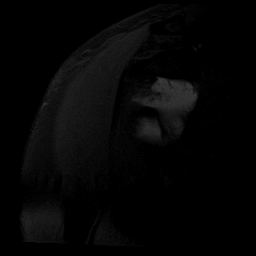
[im 7/16]
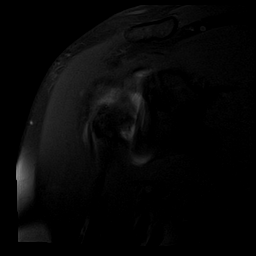
[im 10/16]
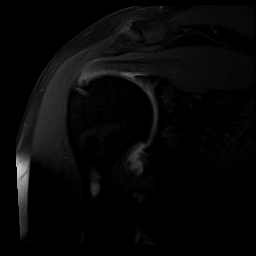
[im 13/16]
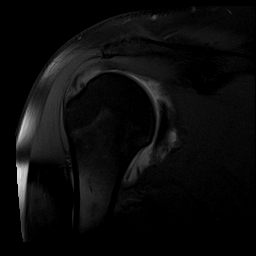
[im 16/16]
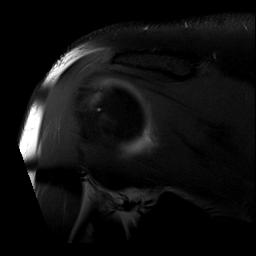

[Series 7: T2 fat-sat · oblique · 4.0mm · 0.55mm/px · 6 of 16 slices shown (2 of 2)]
[im 1/16]
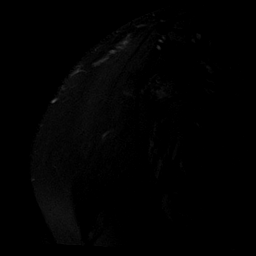
[im 4/16]
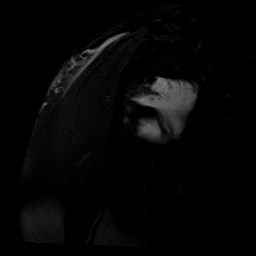
[im 7/16]
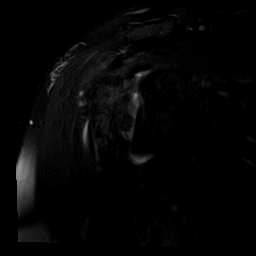
[im 10/16]
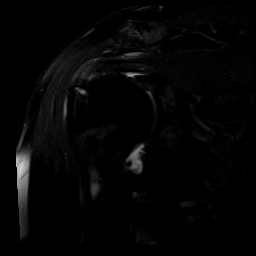
[im 13/16]
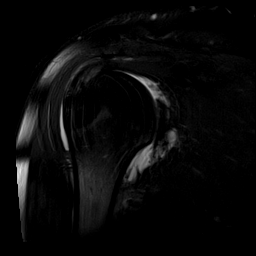
[im 16/16]
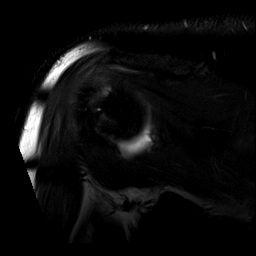

[Series 8: T1 fat-sat · axial · 4.0mm · 0.23mm/px · z∈[-50,+30]mm · 7 of 18 slices shown (3 of 4)]
[im 1/18]
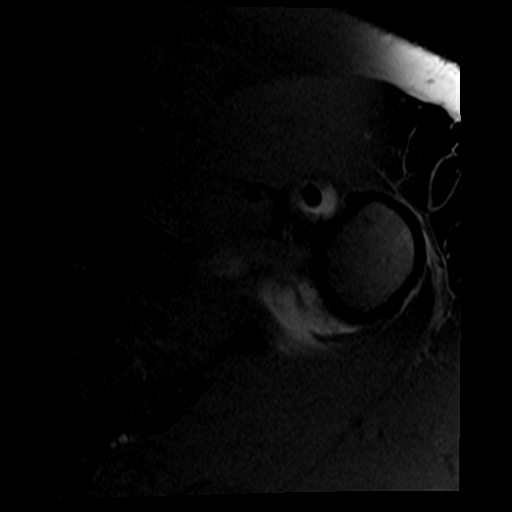
[im 3/18]
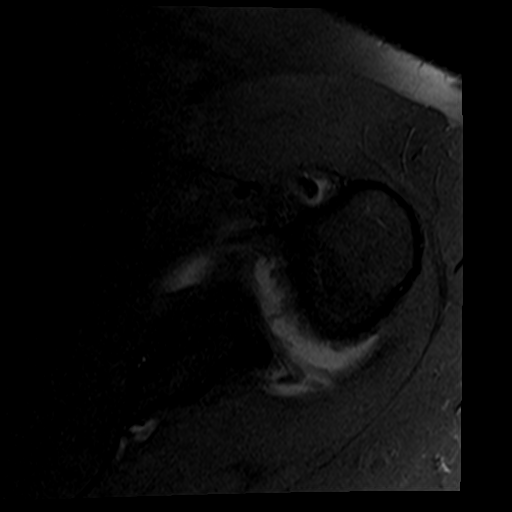
[im 6/18]
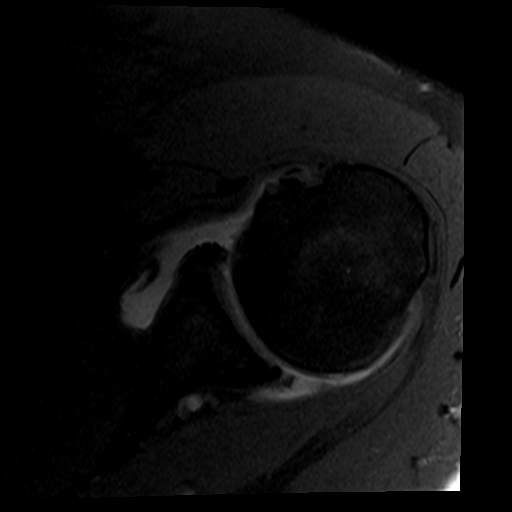
[im 9/18]
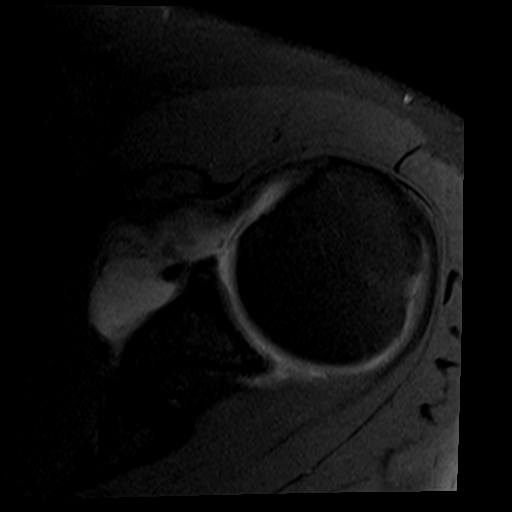
[im 12/18]
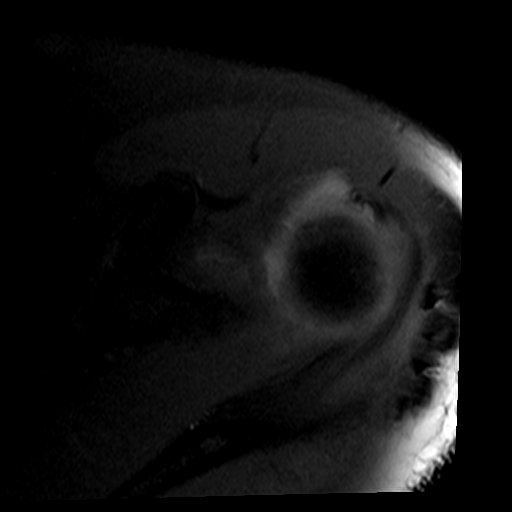
[im 15/18]
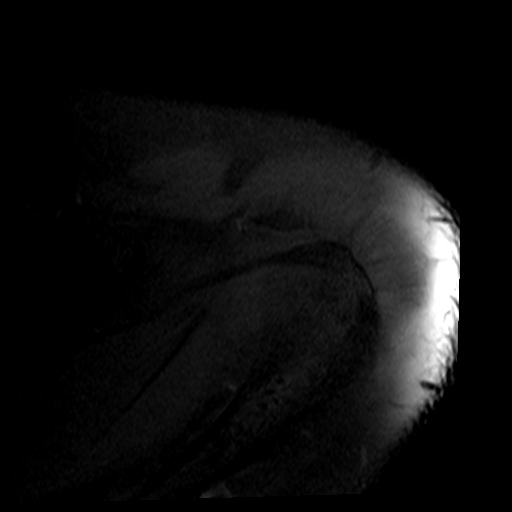
[im 18/18]
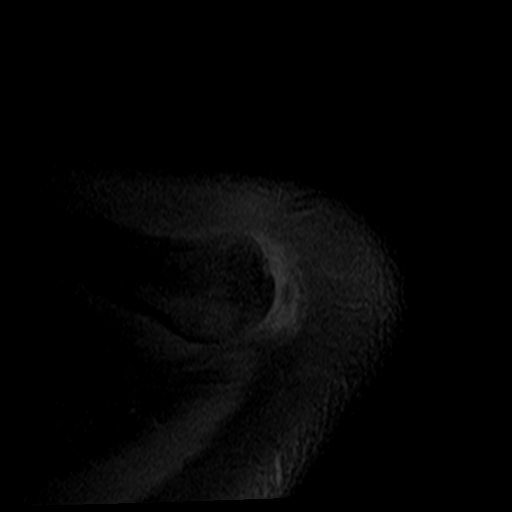

[Series 12: T1 fat-sat · sagittal · 4.0mm · 0.59mm/px · 6 of 16 slices shown (4 of 4)]
[im 1/16]
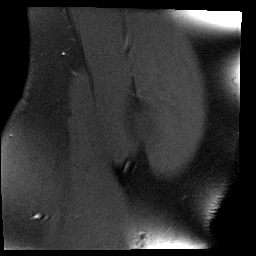
[im 4/16]
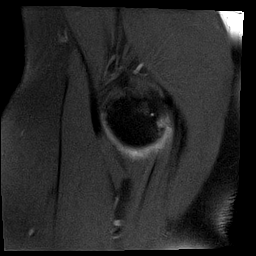
[im 7/16]
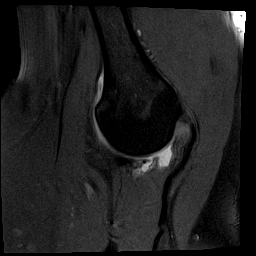
[im 10/16]
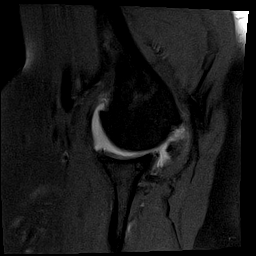
[im 13/16]
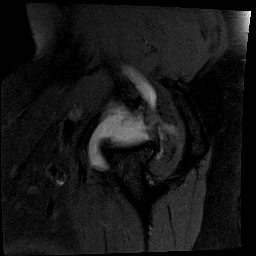
[im 16/16]
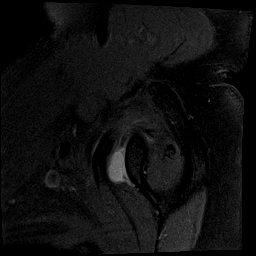

[40 of 40 positions shown; findings below may reference images not displayed]

FINDINGS: Rotator cuff: There is a focal articular surface tear of the
anterior aspect of the distal supraspinous tendon with a tiny
full-thickness component best seen on image 8 of series 6. The tear
is best appreciated on the ABER images.

Muscles: Normal.

Biceps long head: Long head of the biceps tendon diminutive and
subluxed medially at the superior aspect of the bicipital groove
consistent with a partial-thickness articular surface tear of the
superior fibers of the subscapularis.

Acromioclavicular Joint: Minimal arthritis.  Type 2 acromion.

Glenohumeral Joint: Multiple fibrous bands in the posterior aspect
of the joint. Some bands in the subacromial recess. Thinning of the
articular cartilage of the posterior aspect of the glenoid. Surgical
changes of the posterior rim of the glenoid consistent with prior
posterior labral repair.

Labrum: The labrum is irregular in diminutive posteriorly and
inferiorly. No discrete tear.

Bones: No significant abnormality.
IMPRESSION: 1. Tear of the anterior aspect of the distal supraspinous tendon as
described above.
2. Chronic degenerative changes of the posterior inferior aspects of
the repaired glenoid labrum.
3. Slight degenerative arthritis of the posterior aspect of the
glenoid.
4. Diminution and subluxation of the bicipital tendon consistent
with a an articular surface partial thickness tear of the superior
fibers of the subscapularis.

## 2018-03-11 ENCOUNTER — Other Ambulatory Visit: Payer: Self-pay | Admitting: Family Medicine

## 2018-03-11 DIAGNOSIS — I10 Essential (primary) hypertension: Secondary | ICD-10-CM

## 2018-03-15 ENCOUNTER — Telehealth: Payer: Self-pay | Admitting: Family Medicine

## 2018-03-15 DIAGNOSIS — F988 Other specified behavioral and emotional disorders with onset usually occurring in childhood and adolescence: Secondary | ICD-10-CM

## 2018-03-15 NOTE — Telephone Encounter (Signed)
Name of Medication: Methylphenidate Name of Pharmacy: [facility] or Written Date and Quantity: 12/16/17, #90/0 Last Office Visit and Type: 02/09/17, CPE Next Office Visit and Type: none Last Controlled Substance Agreement Date: 02/09/17 Last UDS: 02/09/17

## 2018-03-16 NOTE — Telephone Encounter (Signed)
E prescribed. plz call and schedule physical.  [place] CSRS reviewed

## 2018-03-19 NOTE — Telephone Encounter (Signed)
Left message asking pt to call office  °

## 2018-03-23 NOTE — Telephone Encounter (Signed)
Noted  

## 2018-03-23 NOTE — Telephone Encounter (Signed)
Labs 1/17  Cpx 1/22

## 2018-04-01 ENCOUNTER — Other Ambulatory Visit: Payer: Self-pay | Admitting: Family Medicine

## 2018-04-01 DIAGNOSIS — E785 Hyperlipidemia, unspecified: Secondary | ICD-10-CM

## 2018-04-01 DIAGNOSIS — E039 Hypothyroidism, unspecified: Secondary | ICD-10-CM

## 2018-04-01 DIAGNOSIS — Z125 Encounter for screening for malignant neoplasm of prostate: Secondary | ICD-10-CM

## 2018-04-02 ENCOUNTER — Other Ambulatory Visit (INDEPENDENT_AMBULATORY_CARE_PROVIDER_SITE_OTHER): Payer: BC Managed Care – PPO

## 2018-04-02 DIAGNOSIS — E039 Hypothyroidism, unspecified: Secondary | ICD-10-CM | POA: Diagnosis not present

## 2018-04-02 DIAGNOSIS — E785 Hyperlipidemia, unspecified: Secondary | ICD-10-CM | POA: Diagnosis not present

## 2018-04-02 DIAGNOSIS — Z125 Encounter for screening for malignant neoplasm of prostate: Secondary | ICD-10-CM

## 2018-04-02 LAB — COMPREHENSIVE METABOLIC PANEL
ALT: 23 U/L (ref 0–53)
AST: 21 U/L (ref 0–37)
Albumin: 4.4 g/dL (ref 3.5–5.2)
Alkaline Phosphatase: 49 U/L (ref 39–117)
BUN: 12 mg/dL (ref 6–23)
CO2: 32 mEq/L (ref 19–32)
Calcium: 10.2 mg/dL (ref 8.4–10.5)
Chloride: 101 mEq/L (ref 96–112)
Creatinine, Ser: 1.07 mg/dL (ref 0.40–1.50)
GFR: 73.05 mL/min (ref >60.00)
Glucose, Bld: 98 mg/dL (ref 70–99)
Potassium: 4.7 mEq/L (ref 3.5–5.1)
Sodium: 139 mEq/L (ref 135–145)
Total Bilirubin: 0.6 mg/dL (ref 0.2–1.2)
Total Protein: 7.1 g/dL (ref 6.0–8.3)

## 2018-04-02 LAB — LIPID PANEL
CHOLESTEROL: 197 mg/dL (ref 0–200)
HDL: 41.4 mg/dL (ref >39.00)
LDL Cholesterol: 122 mg/dL — ABNORMAL HIGH (ref 0–99)
NonHDL: 155.84
Total CHOL/HDL Ratio: 5
Triglycerides: 167 mg/dL — ABNORMAL HIGH (ref 0.0–149.0)
VLDL: 33.4 mg/dL (ref 0.0–40.0)

## 2018-04-02 LAB — T4, FREE: Free T4: 1.16 ng/dL (ref 0.60–1.60)

## 2018-04-02 LAB — TSH: TSH: 2.14 u[IU]/mL (ref 0.35–4.50)

## 2018-04-02 LAB — PSA: PSA: 0.67 ng/mL (ref 0.10–4.00)

## 2018-04-06 ENCOUNTER — Encounter: Payer: Self-pay | Admitting: Family Medicine

## 2018-04-06 NOTE — Progress Notes (Signed)
BP 124/68 (BP Location: Left Arm, Patient Position: Sitting, Cuff Size: Large)   Pulse 90   Temp 98.4 F (36.9 C) (Oral)   Ht 6' (1.829 m)   Wt 237 lb 8 oz (107.7 kg)   SpO2 98%   BMI 32.21 kg/m    CC: CPE Subjective:    Patient ID: Chase Burke, male    DOB: Mar 31, 1965, 53 y.o.   MRN: [id number]  HPI: Chase Burke is a 53 y.o. male presenting on 04/07/2018 for Annual Exam   ADD - on concerta 36mg  daily. Denies chest pain, insomnia, appetite changes, headaches.Update UDS today.   Pain management - sees Dr Nicholaus Bloom pain management. H/o cervical arthritis and DDD. On buprenorphine film BID. [place]oulder pain from anterior distal supraspinatus tendon tear with chronic degenerative changes of posterior glenoid labrum s/p 2 shoulder surgeries.   Preventative: Colon cancer screening - discussed, would like colonoscopy in the summer.  Prostate cancer screening - discussed, would like screening given fmhx.  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 Seldomly walking on treadmill Diet:good water, apple and salad daily      Relevant past medical, surgical, family and social history reviewed and updated as indicated. Interim medical history since our last visit reviewed. Allergies and medications reviewed and updated. Outpatient Medications Prior to Visit  Medication Sig Dispense Refill  . aspirin EC 81 MG tablet Take 81 mg by mouth daily.    Marland Kitchen BELBUCA 150 MCG FILM Take 1 Film by mouth 2 (two) times daily.    . cetirizine (ZYRTEC) 10 MG tablet Take 10 mg by mouth daily.    . diphenhydrAMINE  (BENADRYL) 25 MG tablet Take 25-50 mg by mouth at bedtime as needed for sleep.     . fluticasone (FLONASE) 50 MCG/ACT nasal spray Place 2 sprays into both nostrils daily. 16 g 6  . Glucos-MSM-C-Mn-Ginger-Willow (GLUCOSAMINE MSM COMPLEX PO) Take 1 tablet by mouth daily.    . Melatonin 3 MG TABS Take 3 mg by mouth at bedtime.    . METHYLPHENIDATE 36 MG PO CR tablet TAKE 1 TABLET BY MOUTH DAILY. 90 tablet 0  . Multiple Vitamin (MULTIVITAMIN) tablet Take 1 tablet by mouth daily.    . naproxen (NAPROSYN) 250 MG tablet Take 250 mg by mouth 2 (two) times daily with a meal.     . atorvastatin (LIPITOR) 40 MG tablet Take 1 tablet (40 mg total) by mouth daily. 90 tablet 3  . levothyroxine (SYNTHROID, LEVOTHROID) 112 MCG tablet TAKE 1 TABLET BY MOUTH DAILY. 30 tablet 4  . lisinopril (PRINIVIL,ZESTRIL) 5 MG tablet TAKE 1 TABLET BY MOUTH DAILY. 90 tablet 0  . benzonatate (TESSALON PERLES) 100 MG capsule Take 1 capsule (100 mg total) by mouth 3 (three) times daily as needed. (Patient not taking: Reported on 08/22/2017) 20 capsule 0  . Buprenorphine (BUTRANS) 7.5 MCG/HR PTWK Place 1 patch onto the skin once a week.    . typhoid (VIVOTIF) DR capsule Take 1 capsule by mouth every other day. Start 2 weeks prior to travel  4 capsule 0   No facility-administered medications prior to visit.      Per HPI unless specifically indicated in ROS section below Review of Systems  Constitutional: Negative for activity change, appetite change, chills, fatigue, fever and unexpected weight change.  HENT: Negative for hearing loss.   Eyes: Negative for visual disturbance.  Respiratory: Negative for cough, chest tightness, shortness of breath and wheezing.   Cardiovascular: Negative for chest pain, palpitations and leg swelling.  Gastrointestinal: Positive for constipation (slight - narcotic related). Negative for abdominal distention, abdominal pain, blood in stool, diarrhea, nausea and vomiting.  Genitourinary: Negative for  difficulty urinating and hematuria.  Musculoskeletal: Negative for arthralgias, myalgias and neck pain.  Skin: Negative for rash.  Neurological: Positive for dizziness (attributed to [place]). Negative for seizures, syncope and headaches.  Hematological: Negative for adenopathy. Does not bruise/bleed easily.  Psychiatric/Behavioral: Negative for dysphoric mood. The patient is not nervous/anxious.    Objective:    BP 124/68 (BP Location: Left Arm, Patient Position: Sitting, Cuff Size: Large)   Pulse 90   Temp 98.4 F (36.9 C) (Oral)   Ht 6' (1.829 m)   Wt 237 lb 8 oz (107.7 kg)   SpO2 98%   BMI 32.21 kg/m   Wt Readings from Last 3 Encounters:  04/07/18 237 lb 8 oz (107.7 kg)  08/22/17 239 lb 9.6 oz (108.7 kg)  02/09/17 247 lb 8 oz (112.3 kg)    Physical Exam Vitals signs and nursing note reviewed.  Constitutional:      General: He is not in acute distress.    Appearance: He is well-developed.  HENT:     Head: Normocephalic and atraumatic.     Right Ear: Hearing, tympanic membrane, ear canal and external ear normal.     Left Ear: Hearing, tympanic membrane, ear canal and external ear normal.     Nose: Nose normal.     Mouth/Throat:     Pharynx: Uvula midline. No oropharyngeal exudate or posterior oropharyngeal erythema.  Eyes:     General: No scleral icterus.    Conjunctiva/sclera: Conjunctivae normal.     Pupils: Pupils are equal, round, and reactive to light.  Neck:     Musculoskeletal: Normal range of motion and neck supple.     Thyroid: No thyromegaly.  Cardiovascular:     Rate and Rhythm: Normal rate and regular rhythm.     Pulses:          Radial pulses are 2+ on the right side and 2+ on the left side.     Heart sounds: Normal heart sounds. No murmur.  Pulmonary:     Effort: Pulmonary effort is normal. No respiratory distress.     Breath sounds: Normal breath sounds. No wheezing or rales.  Abdominal:     General: Bowel sounds are normal. There is no distension.      Palpations: Abdomen is soft. There is no mass.     Tenderness: There is no abdominal tenderness. There is no guarding or rebound.  Genitourinary:    Prostate: Normal. Not enlarged (20gm), not tender and no nodules present.     Rectum: Normal. No mass, tenderness, anal fissure, external hemorrhoid or internal hemorrhoid. Normal anal tone.  Musculoskeletal: Normal range of motion.  Lymphadenopathy:     Cervical: No cervical adenopathy.  Skin:    General: Skin is warm and dry.     Findings: No rash.  Neurological:     Mental Status: He is alert and oriented to  person, place, and time.     Comments: CN grossly intact, station and gait intact  Psychiatric:        Behavior: Behavior normal.        Thought Content: Thought content normal.        Judgment: Judgment normal.       Results for orders placed or performed in visit on 04/02/18  PSA  Result Value Ref Range   PSA 0.67 0.10 - 4.00 ng/mL  T4, free  Result Value Ref Range   Free T4 1.16 0.60 - 1.60 ng/dL  TSH  Result Value Ref Range   TSH 2.14 0.35 - 4.50 uIU/mL  Comprehensive metabolic panel  Result Value Ref Range   Sodium 139 135 - 145 mEq/L   Potassium 4.7 3.5 - 5.1 mEq/L   Chloride 101 96 - 112 mEq/L   CO2 32 19 - 32 mEq/L   Glucose, Bld 98 70 - 99 mg/dL   BUN 12 6 - 23 mg/dL   Creatinine, Ser 1.07 0.40 - 1.50 mg/dL   Total Bilirubin 0.6 0.2 - 1.2 mg/dL   Alkaline Phosphatase 49 39 - 117 U/L   AST 21 0 - 37 U/L   ALT 23 0 - 53 U/L   Total Protein 7.1 6.0 - 8.3 g/dL   Albumin 4.4 3.5 - 5.2 g/dL   Calcium 10.2 8.4 - 10.5 mg/dL   GFR 73.05 >60.00 mL/min  Lipid panel  Result Value Ref Range   Cholesterol 197 0 - 200 mg/dL   Triglycerides 167.0 (H) 0.0 - 149.0 mg/dL   HDL 41.40 >39.00 mg/dL   VLDL 33.4 0.0 - 40.0 mg/dL   LDL Cholesterol 122 (H) 0 - 99 mg/dL   Total CHOL/HDL Ratio 5    NonHDL 155.84    Assessment & Plan:   Problem List Items Addressed This Visit    Obesity, Class I, BMI 30.0-34.9 (see  actual BMI)    Reviewed healthy diet and lifestyle changes to affect sustainable weight loss.       Hypothyroidism    Chronic, stable. Continue current regimen.       Relevant Medications   levothyroxine (SYNTHROID, LEVOTHROID) 112 MCG tablet   Hyperlipidemia    Chronic, stable on lipitor.  The 10-year ASCVD risk score Mikey Bussing DC Brooke Bonito., et al., 2013) is: 4.6%   Values used to calculate the score:     Age: 10 years     Sex: Male     Is Non-Hispanic African American: No     Diabetic: No     Tobacco smoker: No     Systolic Blood Pressure: 353 mmHg     Is BP treated: Yes     HDL Cholesterol: 41.4 mg/dL     Total Cholesterol: 197 mg/dL       Relevant Medications   atorvastatin (LIPITOR) 40 MG tablet   lisinopril (PRINIVIL,ZESTRIL) 5 MG tablet   Health maintenance examination - Primary    Preventative protocols reviewed and updated unless pt declined. Discussed healthy diet and lifestyle.       Essential hypertension, benign    Chronic, stable on low dose lisinopril. Continue.       Relevant Medications   atorvastatin (LIPITOR) 40 MG tablet   lisinopril (PRINIVIL,ZESTRIL) 5 MG tablet   Chronic pain syndrome    Followed by pain clinic Dr Hardin Negus.       Attention deficit disorder without hyperactivity    Stable period on concerta. Update UDS today.  Relevant Orders   Pain Mgmt, Profile 8 w/Conf, U    Other Visit Diagnoses    Need for hepatitis A vaccination       Relevant Orders   Hepatitis A vaccine adult IM (Completed)       Meds ordered this encounter  Medications  . atorvastatin (LIPITOR) 40 MG tablet    Sig: Take 1 tablet (40 mg total) by mouth daily.    Dispense:  90 tablet    Refill:  3  . levothyroxine (SYNTHROID, LEVOTHROID) 112 MCG tablet    Sig: Take 1 tablet (112 mcg total) by mouth daily.    Dispense:  90 tablet    Refill:  3  . lisinopril (PRINIVIL,ZESTRIL) 5 MG tablet    Sig: Take 1 tablet (5 mg total) by mouth daily.    Dispense:  90  tablet    Refill:  3   Orders Placed This Encounter  Procedures  . Hepatitis A vaccine adult IM  . Pain Mgmt, Profile 8 w/Conf, U    Order Specific Question:   Prescribed drugs 1:    Answer:   METHYLPHENIDATE    Follow up plan: Return in about 1 year (around 04/08/2019) for annual exam, prior fasting for blood work.  Ria Bush, MD

## 2018-04-07 ENCOUNTER — Telehealth: Payer: Self-pay | Admitting: Family Medicine

## 2018-04-07 ENCOUNTER — Ambulatory Visit (INDEPENDENT_AMBULATORY_CARE_PROVIDER_SITE_OTHER): Payer: BC Managed Care – PPO | Admitting: Family Medicine

## 2018-04-07 ENCOUNTER — Encounter: Payer: Self-pay | Admitting: Family Medicine

## 2018-04-07 VITALS — BP 124/68 | HR 90 | Temp 98.4°F | Ht 72.0 in | Wt 237.5 lb

## 2018-04-07 DIAGNOSIS — E039 Hypothyroidism, unspecified: Secondary | ICD-10-CM

## 2018-04-07 DIAGNOSIS — F988 Other specified behavioral and emotional disorders with onset usually occurring in childhood and adolescence: Secondary | ICD-10-CM

## 2018-04-07 DIAGNOSIS — I10 Essential (primary) hypertension: Secondary | ICD-10-CM | POA: Diagnosis not present

## 2018-04-07 DIAGNOSIS — Z0000 Encounter for general adult medical examination without abnormal findings: Secondary | ICD-10-CM | POA: Diagnosis not present

## 2018-04-07 DIAGNOSIS — E785 Hyperlipidemia, unspecified: Secondary | ICD-10-CM

## 2018-04-07 DIAGNOSIS — G894 Chronic pain syndrome: Secondary | ICD-10-CM

## 2018-04-07 DIAGNOSIS — E669 Obesity, unspecified: Secondary | ICD-10-CM

## 2018-04-07 DIAGNOSIS — Z23 Encounter for immunization: Secondary | ICD-10-CM | POA: Diagnosis not present

## 2018-04-07 MED ORDER — LISINOPRIL 5 MG PO TABS: 5.0000 mg | ORAL_TABLET | Freq: Every day | ORAL | 3 refills | Status: DC

## 2018-04-07 MED ORDER — ATORVASTATIN CALCIUM 40 MG PO TABS: 40.0000 mg | ORAL_TABLET | Freq: Every day | ORAL | 3 refills | Status: DC

## 2018-04-07 MED ORDER — LEVOTHYROXINE SODIUM 112 MCG PO TABS: 112.0000 ug | ORAL_TABLET | Freq: Every day | ORAL | 3 refills | Status: DC

## 2018-04-07 NOTE — Assessment & Plan Note (Signed)
Preventative protocols reviewed and updated unless pt declined. Discussed healthy diet and lifestyle.  

## 2018-04-07 NOTE — Telephone Encounter (Signed)
We mistakenly gave patient an opioid contract. He needs non - opioid contract with [place] as he receives narcotics through Pain Management with Dr Laurian Brim. Will need to void current narcotic contract letter dated today. Will fill out non-opioid contract when he returns next year. I tried and called him to discuss, but no answer.

## 2018-04-07 NOTE — Assessment & Plan Note (Signed)
Reviewed healthy diet and lifestyle changes to affect sustainable weight loss.  

## 2018-04-07 NOTE — Patient Instructions (Addendum)
Hep A shot today to complete the series.  Contact me closer to the summer (07/2018) to schedule colonoscopy ([place]).  Medicines refilled today.  You are doing well today.  Update urine drug screen today Return as needed or in 1 year for next physical.   Health Maintenance, Male A healthy lifestyle and preventive care is important for your health and wellness. Ask your health care provider about what schedule of regular examinations is right for you. What should I know about weight and diet? Eat a Healthy Diet  Eat plenty of vegetables, fruits, whole grains, low-fat dairy products, and lean protein.  Do not eat a lot of foods high in solid fats, added sugars, or salt.  Maintain a Healthy Weight Regular exercise can help you achieve or maintain a healthy weight. You should:  Do at least 150 minutes of exercise each week. The exercise should increase your heart rate and make you sweat (moderate-intensity exercise).  Do strength-training exercises at least twice a week. Watch Your Levels of Cholesterol and Blood Lipids  Have your blood tested for lipids and cholesterol every 5 years starting at 51 years of age. If you are at high risk for heart disease, you should start having your blood tested when you are 51 years old. You may need to have your cholesterol levels checked more often if: ? Your lipid or cholesterol levels are high. ? You are older than 51 years of age. ? You are at high risk for heart disease. What should I know about cancer screening? Many types of cancers can be detected early and may often be prevented. Lung Cancer  You should be screened every year for lung cancer if: ? You are a current smoker who has smoked for at least 30 years. ? You are a former smoker who has quit within the past 15 years.  Talk to your health care provider about your screening options, when you should start screening, and how often you should be screened. Colorectal Cancer  Routine  colorectal cancer screening usually begins at 51 years of age and should be repeated every 5-10 years until you are 51 years old. You may need to be screened more often if early forms of precancerous polyps or small growths are found. Your health care provider may recommend screening at an earlier age if you have risk factors for colon cancer.  Your health care provider may recommend using home test kits to check for hidden blood in the stool.  A small camera at the end of a tube can be used to examine your colon (sigmoidoscopy or colonoscopy). This checks for the earliest forms of colorectal cancer. Prostate and Testicular Cancer  Depending on your age and overall health, your health care provider may do certain tests to screen for prostate and testicular cancer.  Talk to your health care provider about any symptoms or concerns you have about testicular or prostate cancer. Skin Cancer  Check your skin from head to toe regularly.  Tell your health care provider about any new moles or changes in moles, especially if: ? There is a change in a mole's size, shape, or color. ? You have a mole that is larger than a pencil eraser.  Always use sunscreen. Apply sunscreen liberally and repeat throughout the day.  Protect yourself by wearing long sleeves, pants, a wide-brimmed hat, and sunglasses when outside. What should I know about heart disease, diabetes, and high blood pressure?  If you are 56-39 years of age, have  your blood pressure checked every 3-5 years. If you are 66 years of age or older, have your blood pressure checked every year. You should have your blood pressure measured twice-once when you are at a hospital or clinic, and once when you are not at a hospital or clinic. Record the average of the two measurements. To check your blood pressure when you are not at a hospital or clinic, you can use: ? An automated blood pressure machine at a pharmacy. ? A home blood pressure  monitor.  Talk to your health care provider about your target blood pressure.  If you are between 3-13 years old, ask your health care provider if you should take aspirin to prevent heart disease.  Have regular diabetes screenings by checking your fasting blood sugar level. ? If you are at a normal weight and have a low risk for diabetes, have this test once every three years after the age of 79. ? If you are overweight and have a high risk for diabetes, consider being tested at a younger age or more often.  A one-time screening for abdominal aortic aneurysm (AAA) by ultrasound is recommended for men aged 2-75 years who are current or former smokers. What should I know about preventing infection? Hepatitis B If you have a higher risk for hepatitis B, you should be screened for this virus. Talk with your health care provider to find out if you are at risk for hepatitis B infection. Hepatitis C Blood testing is recommended for:  Everyone born from [birth year] through 1963.  Anyone with known risk factors for hepatitis C. Sexually Transmitted Diseases (STDs)  You should be screened each year for STDs including gonorrhea and chlamydia if: ? You are sexually active and are younger than 51 years of age. ? You are older than 51 years of age and your health care provider tells you that you are at risk for this type of infection. ? Your sexual activity has changed since you were last screened and you are at an increased risk for chlamydia or gonorrhea. Ask your health care provider if you are at risk.  Talk with your health care provider about whether you are at high risk of being infected with HIV. Your health care provider may recommend a prescription medicine to help prevent HIV infection. What else can I do?  Schedule regular health, dental, and eye exams.  Stay current with your vaccines (immunizations).  Do not use any tobacco products, such as cigarettes, chewing tobacco, and e-cigarettes.  If you need help quitting, ask your health care provider.  Limit alcohol intake to no more than 2 drinks per day. One drink equals 12 ounces of beer, 5 ounces of wine, or 1 ounces of hard liquor.  Do not use street drugs.  Do not share needles.  Ask your health care provider for help if you need support or information about quitting drugs.  Tell your health care provider if you often feel depressed.  Tell your health care provider if you have ever been abused or do not feel safe at home. This information is not intended to replace advice given to you by your health care provider. Make sure you discuss any questions you have with your health care provider. Document Released: 08/30/2007 Document Revised: 10/31/2015 Document Reviewed: 12/05/2014 Elsevier Interactive Patient Education  2019 [employer].

## 2018-04-07 NOTE — Telephone Encounter (Addendum)
Noted.  Spoke with pt relaying Dr. Synthia Innocent message and offering my apology for having him sign the incorrect contract. Informed him I will have the contract from today shredded. Pt verbalizes understanding and expresses his thanks for letting him know.

## 2018-04-07 NOTE — Assessment & Plan Note (Signed)
Followed by pain clinic Dr Hardin Negus.

## 2018-04-07 NOTE — Assessment & Plan Note (Signed)
Chronic, stable. Continue current regimen. 

## 2018-04-07 NOTE — Assessment & Plan Note (Addendum)
Stable period on concerta. Update UDS today.

## 2018-04-07 NOTE — Assessment & Plan Note (Signed)
Chronic, stable on low dose lisinopril. Continue.

## 2018-04-07 NOTE — Assessment & Plan Note (Signed)
Chronic, stable on lipitor.  The 10-year ASCVD risk score Mikey Bussing DC Brooke Bonito., et al., 2013) is: 4.6%   Values used to calculate the score:     Age: 53 years     Sex: Male     Is Non-Hispanic African American: No     Diabetic: No     Tobacco smoker: No     Systolic Blood Pressure: 026 mmHg     Is BP treated: Yes     HDL Cholesterol: 41.4 mg/dL     Total Cholesterol: 197 mg/dL

## 2018-04-08 LAB — PAIN MGMT, PROFILE 8 W/CONF, U
6 Acetylmorphine: NEGATIVE ng/mL (ref <10)
Alcohol Metabolites: NEGATIVE ng/mL (ref <500)
Amphetamines: NEGATIVE ng/mL (ref <500)
BENZODIAZEPINES: NEGATIVE ng/mL (ref <100)
Buprenorphine, Urine: NEGATIVE ng/mL (ref <5)
Cocaine Metabolite: NEGATIVE ng/mL (ref <150)
Creatinine: 25.4 mg/dL
MDMA: NEGATIVE ng/mL (ref <500)
Marijuana Metabolite: NEGATIVE ng/mL (ref <20)
OXYCODONE: NEGATIVE ng/mL (ref <100)
Opiates: NEGATIVE ng/mL (ref <100)
Oxidant: NEGATIVE ug/mL (ref <200)
pH: 6.22 (ref 4.5–9.0)

## 2018-05-03 ENCOUNTER — Encounter: Payer: Self-pay | Admitting: Family Medicine

## 2018-05-04 ENCOUNTER — Telehealth: Payer: Self-pay

## 2018-05-04 NOTE — Telephone Encounter (Signed)
Attempted to submit PA for the Concerta.  Told it was too early to process. [See TE, 05/04/18]  Notified pt via MyChart.

## 2018-05-04 NOTE — Telephone Encounter (Signed)
Received MyChart message from pt stating his PA for Concerta is expiring.   Started PA for methylphenidate (Concerta) 36 mg CR tab, key:  [id number].  Received message to call [phone].  Spoke with a representative stating it is too early to process a PA and pt has another fill left on the med.  Relayed this info to the pt via MyChart.

## 2018-05-23 NOTE — Telephone Encounter (Signed)
Is it time to resubmit PA?  Thanks

## 2018-06-09 ENCOUNTER — Other Ambulatory Visit: Payer: Self-pay | Admitting: Family Medicine

## 2018-06-09 DIAGNOSIS — F988 Other specified behavioral and emotional disorders with onset usually occurring in childhood and adolescence: Secondary | ICD-10-CM

## 2018-06-09 NOTE — Telephone Encounter (Signed)
Name of Medication: Methylphenidate Name of Pharmacy: [facility] or Written Date and Quantity: 03/16/18, #90 Last Office Visit and Type: 04/07/18, CPE Next Office Visit and Type: none Last Controlled Substance Agreement Date: 02/09/17 Last UDS: 04/07/18

## 2018-08-12 ENCOUNTER — Telehealth: Payer: Self-pay | Admitting: Family Medicine

## 2018-08-12 DIAGNOSIS — Z1211 Encounter for screening for malignant neoplasm of colon: Secondary | ICD-10-CM

## 2018-08-12 DIAGNOSIS — Z111 Encounter for screening for respiratory tuberculosis: Secondary | ICD-10-CM

## 2018-08-12 NOTE — Telephone Encounter (Signed)
-----   Message from Ria Bush, MD sent at 04/07/2018 12:03 PM EST ----- Pt would like colonoscopy summer 2020

## 2018-08-12 NOTE — Telephone Encounter (Signed)
Referral placed.

## 2018-08-26 ENCOUNTER — Telehealth: Payer: Self-pay | Admitting: Family Medicine

## 2018-08-26 DIAGNOSIS — E039 Hypothyroidism, unspecified: Secondary | ICD-10-CM

## 2018-08-26 NOTE — Telephone Encounter (Signed)
Spoke to patient about his Colonoscopy referral. He also wants to know when he should come in for his Thyroid tests? He last came in last January 2020. Please advise and call him to set his Lab tests up on [phone].

## 2018-08-27 NOTE — Telephone Encounter (Signed)
Thyroid testing normal 03/2018.  Any specific concerns? As long as feeling well, we can space out testing to next physical.

## 2018-08-27 NOTE — Telephone Encounter (Addendum)
Thyroid function ordered.  May schedule lab visit.

## 2018-08-27 NOTE — Telephone Encounter (Signed)
Spoke with pt relaying Dr. Synthia Innocent message.  Pt verbalizes understanding.  But given h/o the ups and downs, he prefers to have thyroid testing every 6 mos.  So he wants next lab visit in 09/2018.

## 2018-08-27 NOTE — Addendum Note (Signed)
Addended by: Ria Bush on: 08/27/2018 05:19 PM   Modules accepted: Orders

## 2018-09-01 NOTE — Telephone Encounter (Signed)
Pt scheduled for 10/01/18 @ 9:30am

## 2018-09-06 ENCOUNTER — Other Ambulatory Visit: Payer: Self-pay | Admitting: Family Medicine

## 2018-09-06 DIAGNOSIS — F988 Other specified behavioral and emotional disorders with onset usually occurring in childhood and adolescence: Secondary | ICD-10-CM

## 2018-09-07 NOTE — Telephone Encounter (Signed)
Name of Medication: Methylphenidate Name of Pharmacy: [facility] or Written Date and Quantity: 06/10/18, #90 with 0 refills Last Office Visit and Type: 04/07/18, CPE Next Office Visit and Type: none (10/01/18 lab appt) Last Controlled Substance Agreement Date: 02/09/17 Last UDS: 04/07/18

## 2018-09-09 NOTE — Telephone Encounter (Signed)
Eprescribed.

## 2018-09-29 ENCOUNTER — Telehealth: Payer: Self-pay

## 2018-09-29 NOTE — Telephone Encounter (Signed)
Left detailed VM w COVID screen and back door lab info   

## 2018-10-01 ENCOUNTER — Other Ambulatory Visit (INDEPENDENT_AMBULATORY_CARE_PROVIDER_SITE_OTHER): Payer: BC Managed Care – PPO

## 2018-10-01 DIAGNOSIS — E039 Hypothyroidism, unspecified: Secondary | ICD-10-CM

## 2018-10-01 LAB — TSH: TSH: 4.75 u[IU]/mL — ABNORMAL HIGH (ref 0.35–4.50)

## 2018-10-02 ENCOUNTER — Other Ambulatory Visit: Payer: Self-pay | Admitting: Family Medicine

## 2018-10-02 DIAGNOSIS — E039 Hypothyroidism, unspecified: Secondary | ICD-10-CM

## 2018-10-02 MED ORDER — LEVOTHYROXINE SODIUM 125 MCG PO TABS: 125.0000 ug | ORAL_TABLET | Freq: Every day | ORAL | 1 refills | Status: DC

## 2018-12-11 ENCOUNTER — Other Ambulatory Visit: Payer: Self-pay | Admitting: Family Medicine

## 2018-12-11 DIAGNOSIS — F988 Other specified behavioral and emotional disorders with onset usually occurring in childhood and adolescence: Secondary | ICD-10-CM

## 2018-12-13 NOTE — Telephone Encounter (Signed)
Name of Medication:Methylphenidate Name of [facility] or Written Date and Quantity:09/09/2018, #90 with 0 refills Last Office Visit and Type:04/07/18, CPE Next Office Visit and Type:none  Last Controlled Substance Agreement Date:02/09/17 Last UDS:04/07/18

## 2018-12-13 NOTE — Telephone Encounter (Signed)
Eprescribed.

## 2018-12-17 ENCOUNTER — Telehealth: Payer: Self-pay

## 2018-12-17 NOTE — Telephone Encounter (Signed)
Submitted PA for methylphenidate, key:  [id number], Rx #:  [id number].  Decision pending.

## 2018-12-21 ENCOUNTER — Encounter: Payer: Self-pay | Admitting: Family Medicine

## 2018-12-22 NOTE — Telephone Encounter (Signed)
Received faxed PA approval valid 12/21/2018- 12/20/2021.   Made [facility] aware.  Will fill for pt.

## 2018-12-24 ENCOUNTER — Encounter: Payer: Self-pay | Admitting: Gastroenterology

## 2018-12-30 ENCOUNTER — Encounter: Payer: Self-pay | Admitting: Family Medicine

## 2019-01-16 HISTORY — PX: COLONOSCOPY: SHX174

## 2019-01-31 ENCOUNTER — Other Ambulatory Visit: Payer: Self-pay

## 2019-01-31 ENCOUNTER — Encounter: Payer: Self-pay | Admitting: Gastroenterology

## 2019-01-31 ENCOUNTER — Ambulatory Visit (AMBULATORY_SURGERY_CENTER): Payer: Self-pay

## 2019-01-31 VITALS — Temp 96.6°F | Ht 72.0 in | Wt 243.8 lb

## 2019-01-31 DIAGNOSIS — Z1211 Encounter for screening for malignant neoplasm of colon: Secondary | ICD-10-CM

## 2019-01-31 NOTE — Progress Notes (Signed)
Denies allergies to eggs or soy products. Denies complication of anesthesia or sedation. Denies use of weight loss medication. Denies use of O2.   Emmi instructions given for colonoscopy.  Covid screening is scheduled for Wednesday 02/09/19 @ 1:10.

## 2019-02-09 ENCOUNTER — Other Ambulatory Visit: Payer: Self-pay | Admitting: Gastroenterology

## 2019-02-11 LAB — SARS CORONAVIRUS 2 (TAT 6-24 HRS): SARS Coronavirus 2: NEGATIVE

## 2019-02-14 ENCOUNTER — Ambulatory Visit (AMBULATORY_SURGERY_CENTER): Payer: BC Managed Care – PPO | Admitting: Gastroenterology

## 2019-02-14 ENCOUNTER — Other Ambulatory Visit: Payer: Self-pay

## 2019-02-14 ENCOUNTER — Encounter: Payer: Self-pay | Admitting: Gastroenterology

## 2019-02-14 VITALS — BP 111/75 | HR 69 | Temp 98.0°F | Resp 7 | Ht 72.0 in | Wt 243.8 lb

## 2019-02-14 DIAGNOSIS — Z1211 Encounter for screening for malignant neoplasm of colon: Secondary | ICD-10-CM | POA: Diagnosis not present

## 2019-02-14 DIAGNOSIS — D124 Benign neoplasm of descending colon: Secondary | ICD-10-CM

## 2019-02-14 DIAGNOSIS — D122 Benign neoplasm of ascending colon: Secondary | ICD-10-CM | POA: Diagnosis not present

## 2019-02-14 DIAGNOSIS — D125 Benign neoplasm of sigmoid colon: Secondary | ICD-10-CM

## 2019-02-14 DIAGNOSIS — D123 Benign neoplasm of transverse colon: Secondary | ICD-10-CM

## 2019-02-14 MED ORDER — SODIUM CHLORIDE 0.9 % IV SOLN: 500.0000 mL | Freq: Once | INTRAVENOUS | Status: DC

## 2019-02-14 NOTE — Progress Notes (Signed)
To PACU, VSS. Report to [email or web address] 

## 2019-02-14 NOTE — Progress Notes (Signed)
Called to room to assist during endoscopic procedure.  Patient ID and intended procedure confirmed with present staff. Received instructions for my participation in the procedure from the performing physician.  

## 2019-02-14 NOTE — Op Note (Signed)
[place] Patient Name: Chase Burke Procedure Date: 02/14/2019 9:30 AM MRN: [id number] Endoscopist: Milus Banister , MD Age: 53 Referring MD:  Date of Birth: May 05, 1965 Gender: Male Account #: [id number] Procedure:                Colonoscopy Indications:              Screening for colorectal malignant neoplasm Medicines:                Monitored Anesthesia Care Procedure:                Pre-Anesthesia Assessment:                           - Prior to the procedure, a History and Physical                            was performed, and patient medications and                            allergies were reviewed. The patient's tolerance of                            previous anesthesia was also reviewed. The risks                            and benefits of the procedure and the sedation                            options and risks were discussed with the patient.                            All questions were answered, and informed consent                            was obtained. Prior Anticoagulants: The patient has                            taken no previous anticoagulant or antiplatelet                            agents. ASA Grade Assessment: II - A patient with                            mild systemic disease. After reviewing the risks                            and benefits, the patient was deemed in                            satisfactory condition to undergo the procedure.                           After obtaining informed consent, the colonoscope  was passed under direct vision. Throughout the                            procedure, the patient's blood pressure, pulse, and                            oxygen saturations were monitored continuously. The                            Colonoscope was introduced through the anus and                            advanced to the the cecum, identified by                            appendiceal orifice and  ileocecal valve. The                            colonoscopy was performed without difficulty. The                            patient tolerated the procedure well. The quality                            of the bowel preparation was good. The ileocecal                            valve, appendiceal orifice, and rectum were                            photographed. Scope In: 10:00:16 AM Scope Out: 10:15:01 AM Scope Withdrawal Time: 0 hours 11 minutes 10 seconds  Total Procedure Duration: 0 hours 14 minutes 45 seconds  Findings:                 Five sessile polyps were found in the descending                            colon, transverse colon and ascending colon. The                            polyps were 2 to 3 mm in size. These polyps were                            removed with a cold snare. Resection and retrieval                            were complete.                           The exam was otherwise without abnormality on                            direct and retroflexion views. Complications:  No immediate complications. Estimated blood loss:                            None. Estimated Blood Loss:     Estimated blood loss: none. Impression:               - Five 2 to 3 mm polyps in the descending colon, in                            the transverse colon and in the ascending colon,                            removed with a cold snare. Resected and retrieved.                           - The examination was otherwise normal on direct                            and retroflexion views. Recommendation:           - Patient has a contact number available for                            emergencies. The signs and symptoms of potential                            delayed complications were discussed with the                            patient. Return to normal activities tomorrow.                            Written discharge instructions were provided to the                             patient.                           - Resume previous diet.                           - Continue present medications.                           - Await pathology results. Milus Banister, MD 02/14/2019 10:18:24 AM This report has been signed electronically.

## 2019-02-14 NOTE — Patient Instructions (Signed)
Read all of the handouts given to you by your recovery room nurse.  Thank-you for choosing us for your healthcare needs today.  YOU HAD AN ENDOSCOPIC PROCEDURE TODAY AT THE [facility]:   Refer to the procedure report that was given to you for any specific questions about what was found during the examination.  If the procedure report does not answer your questions, please call your gastroenterologist to clarify.  If you requested that your care partner not be given the details of your procedure findings, then the procedure report has been included in a sealed envelope for you to review at your convenience later.  YOU SHOULD EXPECT: Some feelings of bloating in the abdomen. Passage of more gas than usual.  Walking can help get rid of the air that was put into your GI tract during the procedure and reduce the bloating. If you had a lower endoscopy (such as a colonoscopy or flexible sigmoidoscopy) you may notice spotting of blood in your stool or on the toilet paper. If you underwent a bowel prep for your procedure, you may not have a normal bowel movement for a few days.  Please Note:  You might notice some irritation and congestion in your nose or some drainage.  This is from the oxygen used during your procedure.  There is no need for concern and it should clear up in a day or so.  SYMPTOMS TO REPORT IMMEDIATELY:   Following lower endoscopy (colonoscopy or flexible sigmoidoscopy):  Excessive amounts of blood in the stool  Significant tenderness or worsening of abdominal pains  Swelling of the abdomen that is new, acute  Fever of 100F or higher   For urgent or emergent issues, a gastroenterologist can be reached at any hour by calling [phone].   DIET:  We do recommend a small meal at first, but then you may proceed to your regular diet.  Drink plenty of fluids but you should avoid alcoholic beverages for 24 hours.  ACTIVITY:  You should plan to take it easy for the rest of  today and you should NOT DRIVE or use heavy machinery until tomorrow (because of the sedation medicines used during the test).    FOLLOW UP: Our staff will call the number listed on your records 48-72 hours following your procedure to check on you and address any questions or concerns that you may have regarding the information given to you following your procedure. If we do not reach you, we will leave a message.  We will attempt to reach you two times.  During this call, we will ask if you have developed any symptoms of COVID 19. If you develop any symptoms (ie: fever, flu-like symptoms, shortness of breath, cough etc.) before then, please call [phone].  If you test positive for Covid 19 in the 2 weeks post procedure, please call and report this information to us.    If any biopsies were taken you will be contacted by phone or by letter within the next 1-3 weeks.  Please call us at [phone] if you have not heard about the biopsies in 3 weeks.    SIGNATURES/CONFIDENTIALITY: You and/or your care partner have signed paperwork which will be entered into your electronic medical record.  These signatures attest to the fact that that the information above on your After Visit Summary has been reviewed and is understood.  Full responsibility of the confidentiality of this discharge information lies with you and/or your care-partner. 

## 2019-02-14 NOTE — Progress Notes (Signed)
Pt's states no medical or surgical changes since previsit or office visit.  Temp-lc  V/s-sb

## 2019-02-16 ENCOUNTER — Telehealth: Payer: Self-pay

## 2019-02-16 ENCOUNTER — Telehealth: Payer: Self-pay | Admitting: *Deleted

## 2019-02-16 NOTE — Telephone Encounter (Signed)
LVM

## 2019-02-16 NOTE — Telephone Encounter (Signed)
Follow up call made, no answer, left message 

## 2019-02-17 ENCOUNTER — Encounter: Payer: Self-pay | Admitting: Gastroenterology

## 2019-03-21 ENCOUNTER — Other Ambulatory Visit: Payer: Self-pay | Admitting: Family Medicine

## 2019-03-21 DIAGNOSIS — F988 Other specified behavioral and emotional disorders with onset usually occurring in childhood and adolescence: Secondary | ICD-10-CM

## 2019-03-22 NOTE — Telephone Encounter (Signed)
Name of Medication:Methylphenidate Name of [facility] or Written Date and Quantity:12/13/2018, #90with 0 refills Last Office Visit and Type:04/07/18, CPE Next Office Visit and Type:none Last Controlled Substance Agreement Date:02/09/17 Last UDS:04/07/18

## 2019-03-23 ENCOUNTER — Encounter: Payer: Self-pay | Admitting: Family Medicine

## 2019-03-23 ENCOUNTER — Telehealth: Payer: Self-pay | Admitting: Family Medicine

## 2019-03-23 ENCOUNTER — Other Ambulatory Visit: Payer: Self-pay | Admitting: Family Medicine

## 2019-03-23 DIAGNOSIS — E785 Hyperlipidemia, unspecified: Secondary | ICD-10-CM

## 2019-03-23 DIAGNOSIS — E039 Hypothyroidism, unspecified: Secondary | ICD-10-CM

## 2019-03-23 DIAGNOSIS — F988 Other specified behavioral and emotional disorders with onset usually occurring in childhood and adolescence: Secondary | ICD-10-CM

## 2019-03-23 NOTE — Telephone Encounter (Signed)
ERx 

## 2019-03-23 NOTE — Telephone Encounter (Signed)
Appointment made for 04/18/19

## 2019-03-23 NOTE — Telephone Encounter (Signed)
Methylphenidate rx is pending with Dr Darnell Level now.

## 2019-03-23 NOTE — Telephone Encounter (Signed)
Left message for patient to call back and schedule CPE/LABS

## 2019-03-23 NOTE — Telephone Encounter (Signed)
Please schedule patient for CPE when able to thank you

## 2019-03-25 ENCOUNTER — Encounter: Payer: Self-pay | Admitting: Family Medicine

## 2019-03-25 ENCOUNTER — Other Ambulatory Visit: Payer: Self-pay

## 2019-03-25 ENCOUNTER — Ambulatory Visit: Payer: BC Managed Care – PPO | Admitting: Family Medicine

## 2019-03-25 DIAGNOSIS — L03115 Cellulitis of right lower limb: Secondary | ICD-10-CM | POA: Insufficient documentation

## 2019-03-25 MED ORDER — DOXYCYCLINE HYCLATE 100 MG PO TABS: 100.0000 mg | ORAL_TABLET | Freq: Two times a day (BID) | ORAL | 0 refills | Status: DC

## 2019-03-25 NOTE — Patient Instructions (Addendum)
You do have skin infection in R inner thigh - but nothing to drain today. Treat with antibiotic sent to pharmacy.  Warm compresses as much as you can.  May use chlorhexidine solution as needed.   Cellulitis, Adult  Cellulitis is a skin infection. The infected area is usually warm, red, swollen, and tender. This condition occurs most often in the arms and lower legs. The infection can travel to the muscles, blood, and underlying tissue and become serious. It is very important to get treated for this condition. What are the causes? Cellulitis is caused by bacteria. The bacteria enter through a break in the skin, such as a cut, burn, insect bite, open sore, or crack. What increases the risk? This condition is more likely to occur in people who:  Have a weak body defense system (immune system).  Have open wounds on the skin, such as cuts, burns, bites, and scrapes. Bacteria can enter the body through these open wounds.  Are older than 52 years of age.  Have diabetes.  Have a type of long-lasting (chronic) liver disease (cirrhosis) or kidney disease.  Are obese.  Have a skin condition such as: ? Itchy rash (eczema). ? Slow movement of blood in the veins (venous stasis). ? Fluid buildup below the skin (edema).  Have had radiation therapy.  Use IV drugs. What are the signs or symptoms? Symptoms of this condition include:  Redness, streaking, or spotting on the skin.  Swollen area of the skin.  Tenderness or pain when an area of the skin is touched.  Warm skin.  A fever.  Chills.  Blisters. How is this diagnosed? This condition is diagnosed based on a medical history and physical exam. You may also have tests, including:  Blood tests.  Imaging tests. How is this treated? Treatment for this condition may include:  Medicines, such as antibiotic medicines or medicines to treat allergies (antihistamines).  Supportive care, such as rest and application of cold or warm  cloths (compresses) to the skin.  Hospital care, if the condition is severe. The infection usually starts to get better within 1-2 days of treatment. Follow these instructions at home:  Medicines  Take over-the-counter and prescription medicines only as told by your health care provider.  If you were prescribed an antibiotic medicine, take it as told by your health care provider. Do not stop taking the antibiotic even if you start to feel better. General instructions  Drink enough fluid to keep your urine pale yellow.  Do not touch or rub the infected area.  Raise (elevate) the infected area above the level of your heart while you are sitting or lying down.  Apply warm or cold compresses to the affected area as told by your health care provider.  Keep all follow-up visits as told by your health care provider. This is important. These visits let your health care provider make sure a more serious infection is not developing. Contact a health care provider if:  You have a fever.  Your symptoms do not begin to improve within 1-2 days of starting treatment.  Your bone or joint underneath the infected area becomes painful after the skin has healed.  Your infection returns in the same area or another area.  You notice a swollen bump in the infected area.  You develop new symptoms.  You have a general ill feeling (malaise) with muscle aches and pains. Get help right away if:  Your symptoms get worse.  You feel very sleepy.  You develop vomiting or diarrhea that persists.  You notice red streaks coming from the infected area.  Your red area gets larger or turns dark in color. These symptoms may represent a serious problem that is an emergency. Do not wait to see if the symptoms will go away. Get medical help right away. Call your local emergency services (911 in the [place] Do not drive yourself to the hospital. Summary  Cellulitis is a skin infection. This condition occurs  most often in the arms and lower legs.  Treatment for this condition may include medicines, such as antibiotic medicines or antihistamines.  Take over-the-counter and prescription medicines only as told by your health care provider. If you were prescribed an antibiotic medicine, do not stop taking the antibiotic even if you start to feel better.  Contact a health care provider if your symptoms do not begin to improve within 1-2 days of starting treatment or your symptoms get worse.  Keep all follow-up visits as told by your health care provider. This is important. These visits let your health care provider make sure that a more serious infection is not developing. This information is not intended to replace advice given to you by your health care provider. Make sure you discuss any questions you have with your health care provider. Document Revised: 07/23/2017 Document Reviewed: 07/23/2017 Elsevier Patient Education  [place].

## 2019-03-25 NOTE — Progress Notes (Signed)
This visit was conducted in person.  BP 124/80 (BP Location: Left Arm, Patient Position: Sitting, Cuff Size: Large)   Pulse 92   Temp 97.8 F (36.6 C) (Temporal)   Ht 6' (1.829 m)   Wt 241 lb 1 oz (109.3 kg)   SpO2 97%   BMI 32.69 kg/m    CC: R groin cyst Subjective:    Patient ID: Chase Burke, male    DOB: [birth year], 54 y.o.   MRN: [id number]  HPI: Chase Burke is a 54 y.o. male presenting on 03/25/2019 for Cyst (C/o cyst in right groin.  Noticed about 1 yr ago.  Recently, has increased in size, warm to the touch and irritatied. )   R inner thigh lump that has gotten larger and more tender over last 3-4 days. Indurated. Redness is spreading.  No fevers/chills, nausea.  Treating with advil, tylenol (normal chronic pain regimen).  Has chlorhexidine wash at home.   Has been dealing with canker sores in mouth.   Father passed away prior to Christmas due to massive stroke. He lived in [place]. Increased stress recently.      Relevant past medical, surgical, family and social history reviewed and updated as indicated. Interim medical history since our last visit reviewed. Allergies and medications reviewed and updated. Outpatient Medications Prior to Visit  Medication Sig Dispense Refill  . aspirin EC 81 MG tablet Take 81 mg by mouth daily.    Marland Kitchen atorvastatin (LIPITOR) 40 MG tablet TAKE 1 TABLET BY MOUTH DAILY. 90 tablet 0  . buprenorphine (BUTRANS) 10 MCG/HR PTWK patch Place 1 patch onto the skin once a week.    . cetirizine (ZYRTEC) 10 MG tablet Take 10 mg by mouth daily.    . cyclobenzaprine (FLEXERIL) 10 MG tablet 10 mg. Takes 0.5-1 tablet 3 times daily as needed    . diphenhydrAMINE (BENADRYL) 25 MG tablet Take 25-50 mg by mouth at bedtime as needed for sleep.     . fluticasone (FLONASE) 50 MCG/ACT nasal spray Place 2 sprays into both nostrils daily. 16 g 6  . Glucos-MSM-C-Mn-Ginger-Willow (GLUCOSAMINE MSM COMPLEX PO) Take 1 tablet by mouth daily.    Marland Kitchen levothyroxine  (SYNTHROID) 125 MCG tablet TAKE 1 TABLET (125 MCG TOTAL) BY MOUTH DAILY BEFORE BREAKFAST. 90 tablet 0  . lisinopril (PRINIVIL,ZESTRIL) 5 MG tablet Take 1 tablet (5 mg total) by mouth daily. 90 tablet 3  . Melatonin 3 MG TABS Take 3 mg by mouth at bedtime.    . METHYLPHENIDATE 36 MG PO CR tablet TAKE 1 TABLET BY MOUTH DAILY. 90 tablet 0  . Multiple Vitamin (MULTIVITAMIN) tablet Take 1 tablet by mouth daily.    . naproxen (NAPROSYN) 250 MG tablet Take 250 mg by mouth 2 (two) times daily with a meal.      No facility-administered medications prior to visit.     Per HPI unless specifically indicated in ROS section below Review of Systems Objective:    BP 124/80 (BP Location: Left Arm, Patient Position: Sitting, Cuff Size: Large)   Pulse 92   Temp 97.8 F (36.6 C) (Temporal)   Ht 6' (1.829 m)   Wt 241 lb 1 oz (109.3 kg)   SpO2 97%   BMI 32.69 kg/m   Wt Readings from Last 3 Encounters:  03/25/19 241 lb 1 oz (109.3 kg)  02/14/19 243 lb 12.8 oz (110.6 kg)  01/31/19 243 lb 12.8 oz (110.6 kg)    Physical Exam Vitals and nursing note reviewed.  Constitutional:  Appearance: Normal appearance.  Skin:    General: Skin is dry.     Findings: Erythema present.          Comments: Resolving boil R inner upper thigh with area of induration, erythema, tenderness medial to this without fluctuance or active drainage  Neurological:     Mental Status: He is alert.       Results for orders placed or performed in visit on 02/09/19  SARS Coronavirus 2 (TAT 6-24 hrs)  Result Value Ref Range   SARS Coronavirus 2 RESULT:  NEGATIVE    Assessment & Plan:  This visit occurred during the SARS-CoV-2 public health emergency.  Safety protocols were in place, including screening questions prior to the visit, additional usage of staff PPE, and extensive cleaning of exam room while observing appropriate contact time as indicated for disinfecting solutions.   Problem List Items Addressed This Visit     Cellulitis of right thigh    With possible developing abscess. Nothing to drain today. Treat with doxycycline antibiotic to cover MRSA. Discussed warm compresses and continued NSAID use. Update if not improving with treatment.           Meds ordered this encounter  Medications  . doxycycline (VIBRA-TABS) 100 MG tablet    Sig: Take 1 tablet (100 mg total) by mouth 2 (two) times daily.    Dispense:  14 tablet    Refill:  0   No orders of the defined types were placed in this encounter.   Follow up plan: Return if symptoms worsen or fail to improve.  Chase Bush, MD

## 2019-03-25 NOTE — Assessment & Plan Note (Addendum)
With possible developing abscess. Nothing to drain today. Treat with doxycycline antibiotic to cover MRSA. Discussed warm compresses and continued NSAID use. Update if not improving with treatment.

## 2019-03-31 ENCOUNTER — Encounter: Payer: Self-pay | Admitting: Family Medicine

## 2019-04-01 MED ORDER — CEPHALEXIN 500 MG PO CAPS: 500.0000 mg | ORAL_CAPSULE | Freq: Four times a day (QID) | ORAL | 0 refills | Status: DC

## 2019-04-01 NOTE — Telephone Encounter (Signed)
See other mychart message.

## 2019-04-01 NOTE — Telephone Encounter (Signed)
Spoke with patient. Completing doxy course without much improvement. Persistent red lump, may be enlarging.  No fevers, chills, skin bullae, significant pain at site.  Will stop doxy (no improvement), start keflex 500mg  QID course x 1 wk as well - update with effect after 2-3 days and if no improvement will refer to derm or gen surg for further evaluation.  Pt agrees with plan.

## 2019-04-13 ENCOUNTER — Other Ambulatory Visit: Payer: Self-pay | Admitting: Family Medicine

## 2019-04-13 DIAGNOSIS — E785 Hyperlipidemia, unspecified: Secondary | ICD-10-CM

## 2019-04-13 DIAGNOSIS — E039 Hypothyroidism, unspecified: Secondary | ICD-10-CM

## 2019-04-13 DIAGNOSIS — Z125 Encounter for screening for malignant neoplasm of prostate: Secondary | ICD-10-CM

## 2019-04-13 DIAGNOSIS — L03115 Cellulitis of right lower limb: Secondary | ICD-10-CM

## 2019-04-13 NOTE — Addendum Note (Signed)
Addended by: Ria Bush on: 04/13/2019 07:07 AM   Modules accepted: Orders

## 2019-04-14 ENCOUNTER — Other Ambulatory Visit (INDEPENDENT_AMBULATORY_CARE_PROVIDER_SITE_OTHER): Payer: BC Managed Care – PPO

## 2019-04-14 ENCOUNTER — Other Ambulatory Visit: Payer: BC Managed Care – PPO

## 2019-04-14 ENCOUNTER — Other Ambulatory Visit: Payer: Self-pay

## 2019-04-14 DIAGNOSIS — Z125 Encounter for screening for malignant neoplasm of prostate: Secondary | ICD-10-CM

## 2019-04-14 DIAGNOSIS — E785 Hyperlipidemia, unspecified: Secondary | ICD-10-CM | POA: Diagnosis not present

## 2019-04-14 DIAGNOSIS — L03115 Cellulitis of right lower limb: Secondary | ICD-10-CM

## 2019-04-14 DIAGNOSIS — E039 Hypothyroidism, unspecified: Secondary | ICD-10-CM

## 2019-04-14 LAB — LIPID PANEL
Cholesterol: 195 mg/dL (ref 0–200)
HDL: 44.5 mg/dL (ref >39.00)
LDL Cholesterol: 121 mg/dL — ABNORMAL HIGH (ref 0–99)
NonHDL: 150.82
Total CHOL/HDL Ratio: 4
Triglycerides: 151 mg/dL — ABNORMAL HIGH (ref 0.0–149.0)
VLDL: 30.2 mg/dL (ref 0.0–40.0)

## 2019-04-14 LAB — COMPREHENSIVE METABOLIC PANEL
ALT: 24 U/L (ref 0–53)
AST: 21 U/L (ref 0–37)
Albumin: 4.3 g/dL (ref 3.5–5.2)
Alkaline Phosphatase: 56 U/L (ref 39–117)
BUN: 14 mg/dL (ref 6–23)
CO2: 30 mEq/L (ref 19–32)
Calcium: 9.8 mg/dL (ref 8.4–10.5)
Chloride: 103 mEq/L (ref 96–112)
Creatinine, Ser: 0.98 mg/dL (ref 0.40–1.50)
GFR: 80.51 mL/min (ref >60.00)
Glucose, Bld: 101 mg/dL — ABNORMAL HIGH (ref 70–99)
Potassium: 4.9 mEq/L (ref 3.5–5.1)
Sodium: 138 mEq/L (ref 135–145)
Total Bilirubin: 0.6 mg/dL (ref 0.2–1.2)
Total Protein: 6.9 g/dL (ref 6.0–8.3)

## 2019-04-14 LAB — CBC WITH DIFFERENTIAL/PLATELET
Basophils Absolute: 0.1 10*3/uL (ref 0.0–0.1)
Basophils Relative: 1.6 % (ref 0.0–3.0)
Eosinophils Absolute: 0.1 10*3/uL (ref 0.0–0.7)
Eosinophils Relative: 2.1 % (ref 0.0–5.0)
HCT: 42.8 % (ref 39.0–52.0)
Hemoglobin: 14.2 g/dL (ref 13.0–17.0)
Lymphocytes Relative: 41 % (ref 12.0–46.0)
Lymphs Abs: 2 10*3/uL (ref 0.7–4.0)
MCHC: 33.2 g/dL (ref 30.0–36.0)
MCV: 95.2 fl (ref 78.0–100.0)
Monocytes Absolute: 0.5 10*3/uL (ref 0.1–1.0)
Monocytes Relative: 10.2 % (ref 3.0–12.0)
Neutro Abs: 2.2 10*3/uL (ref 1.4–7.7)
Neutrophils Relative %: 45.1 % (ref 43.0–77.0)
Platelets: 230 10*3/uL (ref 150.0–400.0)
RBC: 4.5 Mil/uL (ref 4.22–5.81)
RDW: 13 % (ref 11.5–15.5)
WBC: 4.9 10*3/uL (ref 4.0–10.5)

## 2019-04-14 LAB — TSH: TSH: 2.11 u[IU]/mL (ref 0.35–4.50)

## 2019-04-14 LAB — PSA: PSA: 0.7 ng/mL (ref 0.10–4.00)

## 2019-04-14 LAB — T4, FREE: Free T4: 1.09 ng/dL (ref 0.60–1.60)

## 2019-04-18 ENCOUNTER — Other Ambulatory Visit: Payer: Self-pay

## 2019-04-18 ENCOUNTER — Encounter: Payer: Self-pay | Admitting: Family Medicine

## 2019-04-18 ENCOUNTER — Ambulatory Visit (INDEPENDENT_AMBULATORY_CARE_PROVIDER_SITE_OTHER): Payer: BC Managed Care – PPO | Admitting: Family Medicine

## 2019-04-18 VITALS — BP 118/64 | HR 91 | Temp 97.9°F | Ht 71.75 in | Wt 240.0 lb

## 2019-04-18 DIAGNOSIS — F988 Other specified behavioral and emotional disorders with onset usually occurring in childhood and adolescence: Secondary | ICD-10-CM

## 2019-04-18 DIAGNOSIS — E785 Hyperlipidemia, unspecified: Secondary | ICD-10-CM | POA: Diagnosis not present

## 2019-04-18 DIAGNOSIS — E039 Hypothyroidism, unspecified: Secondary | ICD-10-CM

## 2019-04-18 DIAGNOSIS — Z0000 Encounter for general adult medical examination without abnormal findings: Secondary | ICD-10-CM

## 2019-04-18 DIAGNOSIS — L03115 Cellulitis of right lower limb: Secondary | ICD-10-CM

## 2019-04-18 DIAGNOSIS — I10 Essential (primary) hypertension: Secondary | ICD-10-CM

## 2019-04-18 DIAGNOSIS — G894 Chronic pain syndrome: Secondary | ICD-10-CM

## 2019-04-18 DIAGNOSIS — E669 Obesity, unspecified: Secondary | ICD-10-CM

## 2019-04-18 DIAGNOSIS — M25511 Pain in right shoulder: Secondary | ICD-10-CM

## 2019-04-18 MED ORDER — LEVOTHYROXINE SODIUM 125 MCG PO TABS: 125.0000 ug | ORAL_TABLET | Freq: Every day | ORAL | 3 refills | Status: DC

## 2019-04-18 MED ORDER — ATORVASTATIN CALCIUM 40 MG PO TABS: 40.0000 mg | ORAL_TABLET | Freq: Every day | ORAL | 3 refills | Status: DC

## 2019-04-18 MED ORDER — LISINOPRIL 5 MG PO TABS: 5.0000 mg | ORAL_TABLET | Freq: Every day | ORAL | 3 refills | Status: DC

## 2019-04-18 NOTE — Assessment & Plan Note (Addendum)
Followed by pain clinic Dr Hardin Negus.

## 2019-04-18 NOTE — Assessment & Plan Note (Signed)
Preventative protocols reviewed and updated unless pt declined. Discussed healthy diet and lifestyle.  

## 2019-04-18 NOTE — Assessment & Plan Note (Signed)
Continue concerta 26mg  daily - tolerating well.

## 2019-04-18 NOTE — Progress Notes (Signed)
This visit was conducted in person.  BP 118/64 (BP Location: Left Arm, Patient Position: Sitting, Cuff Size: Large)   Pulse 91   Temp 97.9 F (36.6 C) (Temporal)   Ht 5' 11.75" (1.822 m)   Wt 240 lb (108.9 kg)   SpO2 98%   BMI 32.78 kg/m    CC: CPE Subjective:    Patient ID: Chase Burke, male    DOB: Mar 09, 1966, 54 y.o.   MRN: [id number]  HPI: Demarr Kangas is a 54 y.o. male presenting on 04/18/2019 for Annual Exam   Fully virtual this semester.   Seen here last month with R thigh cellulitis initially treated with doxy course with poor response - this was changed to keflex 500mg  QID 7d course - 95% improvement after course.   ADD - on concerta 36mg  daily. Denies chest pain, dyspnea, insomnia, appetite changes, headaches. Update UDS today. Takes on weekends, feels effect for about 12 hours.   Pain management - sees Dr Nicholaus Bloom pain management. On generic buprenorphine film BID - poor adhesion, rash. H/o cervical arthritis and DDD. 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   Preventative: Colonoscopy - 5 TAs, rpt 3 yrs Ardis Hughs)  Prostate cancer screening - discussed, would like screening given fmhx (father s/p radiation). PSA yearly, DRE QOY (done 2020) Flu shot - declines- h/o arm swelling to previous flu shot  Hep A series today  Tdap 2014 zostavax 2014  shingrix - discussed, start today.  Seat belt use discussed  Sunscreens use discussed, no changing moles on skin  Ex-smoker (2009)  Alcohol - 2-3 beers daily  Dentist yearly - due Eye exam due   Lives with wife and son, no pets  Edu: PhD (E[employer] and Stats) - working on Scientist, water quality in math  Occ: Economy Professor at [employer]  Activity: plays disc golf daily with son Diet:good water, apple and salad daily     Relevant past medical, surgical, family and social history reviewed and updated as indicated.  Interim medical history since our last visit reviewed. Allergies and medications reviewed and updated. Outpatient Medications Prior to Visit  Medication Sig Dispense Refill  . aspirin EC 81 MG tablet Take 81 mg by mouth daily.    . buprenorphine (BUTRANS) 10 MCG/HR PTWK patch Place 1 patch onto the skin once a week.    . cetirizine (ZYRTEC) 10 MG tablet Take 10 mg by mouth daily.    . cyclobenzaprine (FLEXERIL) 10 MG tablet 10 mg. Takes 0.5-1 tablet 3 times daily as needed    . diphenhydrAMINE (BENADRYL) 25 MG tablet Take 25-50 mg by mouth at bedtime as needed for sleep.     . fluticasone (FLONASE) 50 MCG/ACT nasal spray Place 2 sprays into both nostrils daily. 16 g 6  . Glucos-MSM-C-Mn-Ginger-Willow (GLUCOSAMINE MSM COMPLEX PO) Take 1 tablet by mouth daily.    . Melatonin 3 MG TABS Take 3 mg by mouth at bedtime.    . METHYLPHENIDATE 36 MG PO CR tablet TAKE 1 TABLET BY MOUTH DAILY. 90 tablet 0  . Multiple Vitamin (MULTIVITAMIN) tablet Take 1 tablet by mouth daily.    . naproxen (NAPROSYN) 250 MG tablet Take 250 mg by mouth 2 (two) times daily with a meal.     . atorvastatin (LIPITOR) 40 MG tablet TAKE 1 TABLET BY MOUTH DAILY. 90 tablet 0  . cephALEXin (KEFLEX) 500 MG capsule Take 1 capsule (500 mg total)  by mouth 4 (four) times daily. 28 capsule 0  . levothyroxine (SYNTHROID) 125 MCG tablet TAKE 1 TABLET (125 MCG TOTAL) BY MOUTH DAILY BEFORE BREAKFAST. 90 tablet 0  . lisinopril (PRINIVIL,ZESTRIL) 5 MG tablet Take 1 tablet (5 mg total) by mouth daily. 90 tablet 3   No facility-administered medications prior to visit.     Per HPI unless specifically indicated in ROS section below Review of Systems  Constitutional: Negative for activity change, appetite change, chills, fatigue, fever and unexpected weight change.  HENT: Negative for hearing loss.   Eyes: Negative for visual disturbance.  Respiratory: Negative for cough, chest tightness, shortness of breath and wheezing.   Cardiovascular:  Negative for chest pain, palpitations and leg swelling.  Gastrointestinal: Negative for abdominal distention, abdominal pain, blood in stool, constipation, diarrhea, nausea and vomiting.  Genitourinary: Negative for difficulty urinating and hematuria.  Musculoskeletal: Negative for arthralgias, myalgias and neck pain.  Skin: Negative for rash.  Neurological: Negative for dizziness, seizures, syncope and headaches.  Hematological: Negative for adenopathy. Does not bruise/bleed easily.  Psychiatric/Behavioral: Negative for dysphoric mood. The patient is not nervous/anxious.    Objective:    BP 118/64 (BP Location: Left Arm, Patient Position: Sitting, Cuff Size: Large)   Pulse 91   Temp 97.9 F (36.6 C) (Temporal)   Ht 5' 11.75" (1.822 m)   Wt 240 lb (108.9 kg)   SpO2 98%   BMI 32.78 kg/m   Wt Readings from Last 3 Encounters:  04/18/19 240 lb (108.9 kg)  03/25/19 241 lb 1 oz (109.3 kg)  02/14/19 243 lb 12.8 oz (110.6 kg)    Physical Exam Vitals and nursing note reviewed.  Constitutional:      General: He is not in acute distress.    Appearance: Normal appearance. He is well-developed. He is not ill-appearing.  HENT:     Head: Normocephalic and atraumatic.     Right Ear: Hearing, tympanic membrane, ear canal and external ear normal.     Left Ear: Hearing, tympanic membrane, ear canal and external ear normal.     Mouth/Throat:     Pharynx: Uvula midline.  Eyes:     General: No scleral icterus.    Extraocular Movements: Extraocular movements intact.     Conjunctiva/sclera: Conjunctivae normal.     Pupils: Pupils are equal, round, and reactive to light.  Neck:     Thyroid: No thyromegaly or thyroid tenderness.  Cardiovascular:     Rate and Rhythm: Normal rate and regular rhythm.     Pulses: Normal pulses.          Radial pulses are 2+ on the right side and 2+ on the left side.     Heart sounds: Normal heart sounds. No murmur.  Pulmonary:     Effort: Pulmonary effort is  normal. No respiratory distress.     Breath sounds: Normal breath sounds. No wheezing, rhonchi or rales.  Abdominal:     General: Abdomen is flat. Bowel sounds are normal. There is no distension.     Palpations: Abdomen is soft. There is no mass.     Tenderness: There is no abdominal tenderness. There is no guarding or rebound.     Hernia: No hernia is present.  Musculoskeletal:        General: Normal range of motion.     Cervical back: Normal range of motion and neck supple.     Right lower leg: No edema.     Left lower leg: No edema.  Lymphadenopathy:     Cervical: No cervical adenopathy.  Skin:    General: Skin is warm and dry.     Findings: No rash.  Neurological:     General: No focal deficit present.     Mental Status: He is alert and oriented to person, place, and time.     Comments: CN grossly intact, station and gait intact  Psychiatric:        Mood and Affect: Mood normal.        Behavior: Behavior normal.        Thought Content: Thought content normal.        Judgment: Judgment normal.       Results for orders placed or performed in visit on 04/14/19  CBC with Differential/Platelet  Result Value Ref Range   WBC 4.9 4.0 - 10.5 K/uL   RBC 4.50 4.22 - 5.81 Mil/uL   Hemoglobin 14.2 13.0 - 17.0 g/dL   HCT 42.8 39.0 - 52.0 %   MCV 95.2 78.0 - 100.0 fl   MCHC 33.2 30.0 - 36.0 g/dL   RDW 13.0 11.5 - 15.5 %   Platelets 230.0 150.0 - 400.0 K/uL   Neutrophils Relative % 45.1 43.0 - 77.0 %   Lymphocytes Relative 41.0 12.0 - 46.0 %   Monocytes Relative 10.2 3.0 - 12.0 %   Eosinophils Relative 2.1 0.0 - 5.0 %   Basophils Relative 1.6 0.0 - 3.0 %   Neutro Abs 2.2 1.4 - 7.7 K/uL   Lymphs Abs 2.0 0.7 - 4.0 K/uL   Monocytes Absolute 0.5 0.1 - 1.0 K/uL   Eosinophils Absolute 0.1 0.0 - 0.7 K/uL   Basophils Absolute 0.1 0.0 - 0.1 K/uL  PSA  Result Value Ref Range   PSA 0.70 0.10 - 4.00 ng/mL  T4, free  Result Value Ref Range   Free T4 1.09 0.60 - 1.60 ng/dL  TSH  Result  Value Ref Range   TSH 2.11 0.35 - 4.50 uIU/mL  Comprehensive metabolic panel  Result Value Ref Range   Sodium 138 135 - 145 mEq/L   Potassium 4.9 3.5 - 5.1 mEq/L   Chloride 103 96 - 112 mEq/L   CO2 30 19 - 32 mEq/L   Glucose, Bld 101 (H) 70 - 99 mg/dL   BUN 14 6 - 23 mg/dL   Creatinine, Ser 0.98 0.40 - 1.50 mg/dL   Total Bilirubin 0.6 0.2 - 1.2 mg/dL   Alkaline Phosphatase 56 39 - 117 U/L   AST 21 0 - 37 U/L   ALT 24 0 - 53 U/L   Total Protein 6.9 6.0 - 8.3 g/dL   Albumin 4.3 3.5 - 5.2 g/dL   GFR 80.51 >60.00 mL/min   Calcium 9.8 8.4 - 10.5 mg/dL  Lipid panel  Result Value Ref Range   Cholesterol 195 0 - 200 mg/dL   Triglycerides 151.0 (H) 0.0 - 149.0 mg/dL   HDL 44.50 >39.00 mg/dL   VLDL 30.2 0.0 - 40.0 mg/dL   LDL Cholesterol 121 (H) 0 - 99 mg/dL   Total CHOL/HDL Ratio 4    NonHDL 150.82    Assessment & Plan:  This visit occurred during the SARS-CoV-2 public health emergency.  Safety protocols were in place, including screening questions prior to the visit, additional usage of staff PPE, and extensive cleaning of exam room while observing appropriate contact time as indicated for disinfecting solutions.   Problem List Items Addressed This Visit    SHOULDER PAIN, RIGHT, CHRONIC - Primary  Obesity, Class I, BMI 30.0-34.9 (see actual BMI)    Has started playing disc golf regularly.       Hypothyroidism    Chronic, stable. Continue current dose.       Relevant Medications   levothyroxine (SYNTHROID) 125 MCG tablet   Hyperlipidemia    Chronic, stable on lipitor. The 10-year ASCVD risk score Mikey Bussing DC Brooke Bonito., et al., 2013) is: 4.2%   Values used to calculate the score:     Age: 54 years     Sex: Male     Is Non-Hispanic African American: No     Diabetic: No     Tobacco smoker: No     Systolic Blood Pressure: 123456 mmHg     Is BP treated: Yes     HDL Cholesterol: 44.5 mg/dL     Total Cholesterol: 195 mg/dL       Relevant Medications   atorvastatin (LIPITOR) 40 MG  tablet   lisinopril (ZESTRIL) 5 MG tablet   Health maintenance examination    Preventative protocols reviewed and updated unless pt declined. Discussed healthy diet and lifestyle.       Essential hypertension, benign    Chronic, stable on low dose lisinopril.       Relevant Medications   atorvastatin (LIPITOR) 40 MG tablet   lisinopril (ZESTRIL) 5 MG tablet   Chronic pain syndrome    Followed by pain clinic Dr Hardin Negus.       RESOLVED: Cellulitis of right thigh    Largely resolved after keflex course. Will resolve.       Attention deficit disorder without hyperactivity    Continue concerta 26mg  daily - tolerating well.           Meds ordered this encounter  Medications  . atorvastatin (LIPITOR) 40 MG tablet    Sig: Take 1 tablet (40 mg total) by mouth daily.    Dispense:  90 tablet    Refill:  3  . levothyroxine (SYNTHROID) 125 MCG tablet    Sig: Take 1 tablet (125 mcg total) by mouth daily before breakfast.    Dispense:  90 tablet    Refill:  3  . lisinopril (ZESTRIL) 5 MG tablet    Sig: Take 1 tablet (5 mg total) by mouth daily.    Dispense:  90 tablet    Refill:  3   No orders of the defined types were placed in this encounter.   Patient instructions: Check on Shingrix cost. Schedule nurse visit if interested.  Schedule dentist and eye exam. You are doing well today. Return as needed or in 6 months for follow up visit.   Follow up plan: Return in about 6 months (around 10/16/2019) for follow up visit.  Ria Bush, MD

## 2019-04-18 NOTE — Assessment & Plan Note (Signed)
Chronic, stable on low dose lisinopril.

## 2019-04-18 NOTE — Assessment & Plan Note (Signed)
Chronic, stable on lipitor. The 10-year ASCVD risk score Mikey Bussing DC Brooke Bonito., et al., 2013) is: 4.2%   Values used to calculate the score:     Age: 54 years     Sex: Male     Is Non-Hispanic African American: No     Diabetic: No     Tobacco smoker: No     Systolic Blood Pressure: 123456 mmHg     Is BP treated: Yes     HDL Cholesterol: 44.5 mg/dL     Total Cholesterol: 195 mg/dL

## 2019-04-18 NOTE — Patient Instructions (Addendum)
Check on Shingrix cost. Schedule nurse visit if interested.  Schedule dentist and eye exam. You are doing well today. Return as needed or in 6 months for follow up visit.   Health Maintenance, Male Adopting a healthy lifestyle and getting preventive care are important in promoting health and wellness. Ask your health care provider about:  The right schedule for you to have regular tests and exams.  Things you can do on your own to prevent diseases and keep yourself healthy. What should I know about diet, weight, and exercise? Eat a healthy diet   Eat a diet that includes plenty of vegetables, fruits, low-fat dairy products, and lean protein.  Do not eat a lot of foods that are high in solid fats, added sugars, or sodium. Maintain a healthy weight Body mass index (BMI) is a measurement that can be used to identify possible weight problems. It estimates body fat based on height and weight. Your health care provider can help determine your BMI and help you achieve or maintain a healthy weight. Get regular exercise Get regular exercise. This is one of the most important things you can do for your health. Most adults should:  Exercise for at least 150 minutes each week. The exercise should increase your heart rate and make you sweat (moderate-intensity exercise).  Do strengthening exercises at least twice a week. This is in addition to the moderate-intensity exercise.  Spend less time sitting. Even light physical activity can be beneficial. Watch cholesterol and blood lipids Have your blood tested for lipids and cholesterol at 52 years of age, then have this test every 5 years. You may need to have your cholesterol levels checked more often if:  Your lipid or cholesterol levels are high.  You are older than 52 years of age.  You are at high risk for heart disease. What should I know about cancer screening? Many types of cancers can be detected early and may often be prevented.  Depending on your health history and family history, you may need to have cancer screening at various ages. This may include screening for:  Colorectal cancer.  Prostate cancer.  Skin cancer.  Lung cancer. What should I know about heart disease, diabetes, and high blood pressure? Blood pressure and heart disease  High blood pressure causes heart disease and increases the risk of stroke. This is more likely to develop in people who have high blood pressure readings, are of African descent, or are overweight.  Talk with your health care provider about your target blood pressure readings.  Have your blood pressure checked: ? Every 3-5 years if you are 79-76 years of age. ? Every year if you are 7 years old or older.  If you are between the ages of 81 and 22 and are a current or former smoker, ask your health care provider if you should have a one-time screening for abdominal aortic aneurysm (AAA). Diabetes Have regular diabetes screenings. This checks your fasting blood sugar level. Have the screening done:  Once every three years after age 81 if you are at a normal weight and have a low risk for diabetes.  More often and at a younger age if you are overweight or have a high risk for diabetes. What should I know about preventing infection? Hepatitis B If you have a higher risk for hepatitis B, you should be screened for this virus. Talk with your health care provider to find out if you are at risk for hepatitis B infection. Hepatitis  C Blood testing is recommended for:  Everyone born from [birth year] through 1963.  Anyone with known risk factors for hepatitis C. Sexually transmitted infections (STIs)  You should be screened each year for STIs, including gonorrhea and chlamydia, if: ? You are sexually active and are younger than 52 years of age. ? You are older than 52 years of age and your health care provider tells you that you are at risk for this type of infection. ? Your sexual  activity has changed since you were last screened, and you are at increased risk for chlamydia or gonorrhea. Ask your health care provider if you are at risk.  Ask your health care provider about whether you are at high risk for HIV. Your health care provider may recommend a prescription medicine to help prevent HIV infection. If you choose to take medicine to prevent HIV, you should first get tested for HIV. You should then be tested every 3 months for as long as you are taking the medicine. Follow these instructions at home: Lifestyle  Do not use any products that contain nicotine or tobacco, such as cigarettes, e-cigarettes, and chewing tobacco. If you need help quitting, ask your health care provider.  Do not use street drugs.  Do not share needles.  Ask your health care provider for help if you need support or information about quitting drugs. Alcohol use  Do not drink alcohol if your health care provider tells you not to drink.  If you drink alcohol: ? Limit how much you have to 0-2 drinks a day. ? Be aware of how much alcohol is in your drink. In the [place] one drink equals one 12 oz bottle of beer (355 mL), one 5 oz glass of wine (148 mL), or one 1 oz glass of hard liquor (44 mL). General instructions  Schedule regular health, dental, and eye exams.  Stay current with your vaccines.  Tell your health care provider if: ? You often feel depressed. ? You have ever been abused or do not feel safe at home. Summary  Adopting a healthy lifestyle and getting preventive care are important in promoting health and wellness.  Follow your health care provider's instructions about healthy diet, exercising, and getting tested or screened for diseases.  Follow your health care provider's instructions on monitoring your cholesterol and blood pressure. This information is not intended to replace advice given to you by your health care provider. Make sure you discuss any questions you have  with your health care provider. Document Revised: 02/24/2018 Document Reviewed: 02/24/2018 Elsevier Patient Education  2020 [employer].

## 2019-04-18 NOTE — Assessment & Plan Note (Signed)
Largely resolved after keflex course. Will resolve.

## 2019-04-18 NOTE — Assessment & Plan Note (Signed)
Chronic, stable. Continue current dose.

## 2019-04-18 NOTE — Assessment & Plan Note (Signed)
Has started playing disc golf regularly.

## 2019-05-26 ENCOUNTER — Ambulatory Visit: Payer: BC Managed Care – PPO | Attending: Internal Medicine

## 2019-05-26 DIAGNOSIS — Z23 Encounter for immunization: Secondary | ICD-10-CM

## 2019-05-26 NOTE — Progress Notes (Signed)
   Covid-19 Vaccination Clinic  Name:  Chase Burke    MRN: [id number] DOB: May 11, 1965  05/26/2019  [name] was observed post Covid-19 immunization for 30 minutes based on pre-vaccination screening without incident. He was provided with Vaccine Information Sheet and instruction to access the V-Safe system.   [name] was instructed to call 911 with any severe reactions post vaccine: Marland Kitchen Difficulty breathing  . Swelling of face and throat  . A fast heartbeat  . A bad rash all over body  . Dizziness and weakness   Immunizations Administered    Name Date Dose VIS Date Route   Moderna COVID-19 Vaccine 05/26/2019 12:56 PM 0.5 mL 02/15/2019 Intramuscular   Manufacturer: Moderna   Lot: [id number]   [id number]

## 2019-06-16 ENCOUNTER — Other Ambulatory Visit: Payer: Self-pay | Admitting: Family Medicine

## 2019-06-16 DIAGNOSIS — F988 Other specified behavioral and emotional disorders with onset usually occurring in childhood and adolescence: Secondary | ICD-10-CM

## 2019-06-17 NOTE — Telephone Encounter (Signed)
ERx 

## 2019-06-28 ENCOUNTER — Ambulatory Visit: Payer: BC Managed Care – PPO | Attending: Family

## 2019-06-28 DIAGNOSIS — Z23 Encounter for immunization: Secondary | ICD-10-CM

## 2019-06-28 NOTE — Progress Notes (Signed)
   Covid-19 Vaccination Clinic  Name:  Chase Burke    MRN: [id number] DOB: 1966/01/13  06/28/2019  Chase Burke was observed post Covid-19 immunization for 15 minutes without incident. He was provided with Vaccine Information Sheet and instruction to access the V-Safe system.   Chase Burke was instructed to call 911 with any severe reactions post vaccine: Marland Kitchen Difficulty breathing  . Swelling of face and throat  . A fast heartbeat  . A bad rash all over body  . Dizziness and weakness   Immunizations Administered    Name Date Dose VIS Date Route   Moderna COVID-19 Vaccine 06/28/2019 12:53 PM 0.5 mL 02/15/2019 Intramuscular   Manufacturer: Moderna   Lot: [id number]   [id number]

## 2019-08-03 ENCOUNTER — Encounter: Payer: Self-pay | Admitting: Family Medicine

## 2019-08-08 ENCOUNTER — Encounter: Payer: Self-pay | Admitting: Family Medicine

## 2019-08-08 MED ORDER — CEPHALEXIN 500 MG PO CAPS: 500.0000 mg | ORAL_CAPSULE | Freq: Four times a day (QID) | ORAL | 0 refills | Status: DC

## 2019-09-13 ENCOUNTER — Other Ambulatory Visit: Payer: Self-pay | Admitting: Family Medicine

## 2019-09-13 DIAGNOSIS — F988 Other specified behavioral and emotional disorders with onset usually occurring in childhood and adolescence: Secondary | ICD-10-CM

## 2019-09-13 NOTE — Telephone Encounter (Signed)
Name of Medication: Methylphenidate Name of Pharmacy: [facility] or Written Date and Quantity: 06/17/19, #90 Last Office Visit and Type: 04/18/19, CPE Next Office Visit and Type: none Last Controlled Substance Agreement Date: 02/09/17 Last UDS: 04/07/18

## 2019-09-14 NOTE — Telephone Encounter (Signed)
ERx 

## 2019-12-12 ENCOUNTER — Other Ambulatory Visit: Payer: Self-pay | Admitting: Family Medicine

## 2019-12-12 DIAGNOSIS — F988 Other specified behavioral and emotional disorders with onset usually occurring in childhood and adolescence: Secondary | ICD-10-CM

## 2019-12-12 MED ORDER — METHYLPHENIDATE HCL ER (OSM) 36 MG PO TBCR: 36.0000 mg | EXTENDED_RELEASE_TABLET | Freq: Every day | ORAL | 0 refills | Status: DC

## 2019-12-12 NOTE — Addendum Note (Signed)
Addended by: Ria Bush on: 12/12/2019 02:27 PM   Modules accepted: Orders

## 2019-12-12 NOTE — Telephone Encounter (Signed)
ERx 

## 2019-12-12 NOTE — Telephone Encounter (Signed)
Noted failed E prescribing of methylphenidate so I've sent again.  plz call pharmacy to ensure they received - what is best way to get Rx over if E prescribing doesn't work?

## 2019-12-14 NOTE — Telephone Encounter (Signed)
Plz deny.  Rx already filled on 12/12/19.

## 2019-12-14 NOTE — Telephone Encounter (Signed)
Spoke with [facility].  Says they received rx and filled it on 12/12/19.

## 2019-12-16 NOTE — Telephone Encounter (Signed)
Pt also asking about needing to do PA for med.

## 2019-12-19 ENCOUNTER — Telehealth: Payer: Self-pay

## 2019-12-19 NOTE — Telephone Encounter (Signed)
See TE, 12/19/19.

## 2019-12-19 NOTE — Telephone Encounter (Addendum)
Spoke with Marissa of [facility] to process PA.  States current PA is valid through 12/20/2021.   Spoke with pt relaying info above.  Verbalizes understanding.

## 2019-12-19 NOTE — Telephone Encounter (Signed)
Submitted PA for methylphenidate 36 mg CR tab; key:  [id number].  Received message it is too early to refill med.  Call [facility] at [phone] directly to renew expiring PA.

## 2020-03-06 ENCOUNTER — Other Ambulatory Visit: Payer: Self-pay | Admitting: Family Medicine

## 2020-03-06 DIAGNOSIS — F988 Other specified behavioral and emotional disorders with onset usually occurring in childhood and adolescence: Secondary | ICD-10-CM

## 2020-03-06 NOTE — Telephone Encounter (Signed)
ERx 

## 2020-03-06 NOTE — Telephone Encounter (Signed)
Name of Medication: Methylphenidate Name of Pharmacy: [facility] or Written Date and Quantity: 09/14/19, #90 Last Office Visit and Type: 04/18/19, CPE Next Office Visit and Type: none Last Controlled Substance Agreement Date: 02/09/17 Last UDS: 04/07/18

## 2020-03-30 ENCOUNTER — Encounter: Payer: Self-pay | Admitting: Family Medicine

## 2020-03-30 ENCOUNTER — Other Ambulatory Visit: Payer: Self-pay

## 2020-03-30 ENCOUNTER — Ambulatory Visit (INDEPENDENT_AMBULATORY_CARE_PROVIDER_SITE_OTHER): Payer: BC Managed Care – PPO | Admitting: Family Medicine

## 2020-03-30 VITALS — BP 118/78 | HR 85 | Temp 97.7°F | Ht 71.75 in | Wt 221.1 lb

## 2020-03-30 DIAGNOSIS — E039 Hypothyroidism, unspecified: Secondary | ICD-10-CM

## 2020-03-30 LAB — TSH: TSH: 1.76 u[IU]/mL (ref 0.35–4.50)

## 2020-03-30 LAB — T4, FREE: Free T4: 1.11 ng/dL (ref 0.60–1.60)

## 2020-03-30 NOTE — Progress Notes (Signed)
Patient ID: Lamario Mani, male    DOB: April 20, 1965, 55 y.o.   MRN: [id number]  This visit was conducted in person.  BP 118/78 (BP Location: Left Arm, Patient Position: Sitting, Cuff Size: Large)   Pulse 85   Temp 97.7 F (36.5 C) (Temporal)   Ht 5' 11.75" (1.822 m)   Wt 221 lb 2 oz (100.3 kg)   SpO2 99%   BMI 30.20 kg/m    CC: fatigue, joint pain Subjective:   HPI: Yousuf Ager is a 55 y.o. male presenting on 03/30/2020 for Fatigue (C/o fatigue, joint pain and trouble focusing.  Pt thought he was just here for thyroid labs. )   Hasn't been feeling well over the past few months - increasing generalized joint aches, trouble with concentration/focus, noticing panic attacks/difficulty when in large goups. All these symptoms have previously occurred when his thyroid levels are too low.   Notes increased fatigue as well as cold intolerance and increased constipation. Notes pruritic without rash.  No diarrhea, skin or hair changes or unexpected weight changes.   Has not had COVID. He received COVID booster.   Chronic pain followed by pain clinic Dr Hardin Negus on buprenorphine patch weekly.   20 lb weight loss over the past year - playing disc golf daily - plays with local club, exercising 2 hours every day.   Denies significant depressed mood, anhedonia.      Relevant past medical, surgical, family and social history reviewed and updated as indicated. Interim medical history since our last visit reviewed. Allergies and medications reviewed and updated. Outpatient Medications Prior to Visit  Medication Sig Dispense Refill  . aspirin EC 81 MG tablet Take 81 mg by mouth daily.    Marland Kitchen atorvastatin (LIPITOR) 40 MG tablet Take 1 tablet (40 mg total) by mouth daily. 90 tablet 3  . buprenorphine (BUTRANS) 10 MCG/HR PTWK Place 1 patch onto the skin once a week.    . cetirizine (ZYRTEC) 10 MG tablet Take 10 mg by mouth daily.    . cyclobenzaprine (FLEXERIL) 10 MG tablet 10 mg. Takes 0.5-1 tablet  3 times daily as needed    . diphenhydrAMINE (BENADRYL) 25 MG tablet Take 25-50 mg by mouth at bedtime as needed for sleep.     . fluticasone (FLONASE) 50 MCG/ACT nasal spray Place 2 sprays into both nostrils daily. 16 g 6  . Glucos-MSM-C-Mn-Ginger-Willow (GLUCOSAMINE MSM COMPLEX PO) Take 1 tablet by mouth daily.    Marland Kitchen levothyroxine (SYNTHROID) 125 MCG tablet Take 1 tablet (125 mcg total) by mouth daily before breakfast. 90 tablet 3  . lisinopril (ZESTRIL) 5 MG tablet Take 1 tablet (5 mg total) by mouth daily. 90 tablet 3  . Melatonin 3 MG TABS Take 3 mg by mouth at bedtime.    . METHYLPHENIDATE 36 MG PO CR tablet TAKE 1 TABLET BY MOUTH DAILY. 90 tablet 0  . Multiple Vitamin (MULTIVITAMIN) tablet Take 1 tablet by mouth daily.    . naproxen (NAPROSYN) 250 MG tablet Take 250 mg by mouth 2 (two) times daily with a meal.     . cephALEXin (KEFLEX) 500 MG capsule Take 1 capsule (500 mg total) by mouth 4 (four) times daily. 40 capsule 0   No facility-administered medications prior to visit.     Per HPI unless specifically indicated in ROS section below Review of Systems Objective:  BP 118/78 (BP Location: Left Arm, Patient Position: Sitting, Cuff Size: Large)   Pulse 85   Temp 97.7 F (36.5  C) (Temporal)   Ht 5' 11.75" (1.822 m)   Wt 221 lb 2 oz (100.3 kg)   SpO2 99%   BMI 30.20 kg/m   Wt Readings from Last 3 Encounters:  03/30/20 221 lb 2 oz (100.3 kg)  04/18/19 240 lb (108.9 kg)  03/25/19 241 lb 1 oz (109.3 kg)      Physical Exam Vitals and nursing note reviewed.  Constitutional:      Appearance: Normal appearance. He is not ill-appearing.  Neck:     Thyroid: No thyroid mass or thyromegaly.  Cardiovascular:     Rate and Rhythm: Normal rate and regular rhythm.     Pulses: Normal pulses.     Heart sounds: Normal heart sounds. No murmur heard.   Pulmonary:     Effort: Pulmonary effort is normal. No respiratory distress.     Breath sounds: Normal breath sounds. No wheezing,  rhonchi or rales.  Musculoskeletal:        General: Normal range of motion.     Cervical back: Normal range of motion.     Right lower leg: No edema.     Left lower leg: No edema.  Skin:    General: Skin is warm and dry.     Findings: No rash.  Neurological:     Mental Status: He is alert.  Psychiatric:        Mood and Affect: Mood normal.        Behavior: Behavior normal.       Lab Results  Component Value Date   TSH 2.11 04/14/2019   T3TOTAL 107.0 08/21/2015    Assessment & Plan:  This visit occurred during the SARS-CoV-2 public health emergency.  Safety protocols were in place, including screening questions prior to the visit, additional usage of staff PPE, and extensive cleaning of exam room while observing appropriate contact time as indicated for disinfecting solutions.   Problem List Items Addressed This Visit    Hypothyroidism - Primary    Exhibiting many low thyroid symptoms.  Update labs today.  Will contact with results and plan.  Return for CPE in 05/2020      Relevant Orders   TSH   T4, free       No orders of the defined types were placed in this encounter.  Orders Placed This Encounter  Procedures  . TSH  . T4, free    Follow up plan: Return if symptoms worsen or fail to improve.  Ria Bush, MD

## 2020-03-30 NOTE — Assessment & Plan Note (Signed)
Exhibiting many low thyroid symptoms.  Update labs today.  Will contact with results and plan.  Return for CPE in 05/2020

## 2020-03-30 NOTE — Patient Instructions (Signed)
You do have several low thyroid symptoms.  Labs today - we will be in touch with results and plan.  Good to see you today.

## 2020-04-08 ENCOUNTER — Ambulatory Visit
Admission: EM | Admit: 2020-04-08 | Discharge: 2020-04-08 | Disposition: A | Discharge status: Home / Self Care | Payer: BC Managed Care – PPO | Attending: Urgent Care | Admitting: Urgent Care

## 2020-04-08 ENCOUNTER — Encounter: Payer: Self-pay | Admitting: Emergency Medicine

## 2020-04-08 ENCOUNTER — Other Ambulatory Visit: Payer: Self-pay

## 2020-04-08 DIAGNOSIS — L03213 Periorbital cellulitis: Secondary | ICD-10-CM

## 2020-04-08 MED ORDER — CEFDINIR 300 MG PO CAPS: 300.0000 mg | ORAL_CAPSULE | Freq: Two times a day (BID) | ORAL | 0 refills | Status: DC

## 2020-04-08 NOTE — ED Triage Notes (Signed)
Pt c/o of left eye swelling and drainage x 2 days. Denies injury.

## 2020-04-08 NOTE — ED Provider Notes (Signed)
[facility]   MRN: [id number] DOB: 19-Apr-1965  Subjective:   Willies Laviolette is a 55 y.o. male presenting for 2-day history of acute onset persistent and worsening left eye pain worse over the eyelid and further in.  Denies vision change, trauma, foreign body sensation, redness, drainage.  Has used Aleve with some relief.  No current facility-administered medications for this encounter.  Current Outpatient Medications:  .  aspirin EC 81 MG tablet, Take 81 mg by mouth daily., Disp: , Rfl:  .  atorvastatin (LIPITOR) 40 MG tablet, Take 1 tablet (40 mg total) by mouth daily., Disp: 90 tablet, Rfl: 3 .  buprenorphine (BUTRANS) 10 MCG/HR PTWK, Place 1 patch onto the skin once a week., Disp: , Rfl:  .  cetirizine (ZYRTEC) 10 MG tablet, Take 10 mg by mouth daily., Disp: , Rfl:  .  cyclobenzaprine (FLEXERIL) 10 MG tablet, 10 mg. Takes 0.5-1 tablet 3 times daily as needed, Disp: , Rfl:  .  diphenhydrAMINE (BENADRYL) 25 MG tablet, Take 25-50 mg by mouth at bedtime as needed for sleep. , Disp: , Rfl:  .  Glucos-MSM-C-Mn-Ginger-Willow (GLUCOSAMINE MSM COMPLEX PO), Take 1 tablet by mouth daily., Disp: , Rfl:  .  levothyroxine (SYNTHROID) 125 MCG tablet, Take 1 tablet (125 mcg total) by mouth daily before breakfast., Disp: 90 tablet, Rfl: 3 .  lisinopril (ZESTRIL) 5 MG tablet, Take 1 tablet (5 mg total) by mouth daily., Disp: 90 tablet, Rfl: 3 .  Melatonin 3 MG TABS, Take 3 mg by mouth at bedtime., Disp: , Rfl:  .  METHYLPHENIDATE 36 MG PO CR tablet, TAKE 1 TABLET BY MOUTH DAILY., Disp: 90 tablet, Rfl: 0 .  Multiple Vitamin (MULTIVITAMIN) tablet, Take 1 tablet by mouth daily., Disp: , Rfl:  .  naproxen (NAPROSYN) 250 MG tablet, Take 250 mg by mouth 2 (two) times daily with a meal. , Disp: , Rfl:  .  fluticasone (FLONASE) 50 MCG/ACT nasal spray, Place 2 sprays into both nostrils daily., Disp: 16 g, Rfl: 6   Allergies  Allergen Reactions  . Ibuprofen Other (See Comments)    Difficulty  concentrating  . Influenza Vaccines Other (See Comments)    Fever, arm swelling    Past Medical History:  Diagnosis Date  . ADD (attention deficit disorder with hyperactivity)   . Allergy   . Asthma   . Chronic pain   . DDD (degenerative disc disease), cervical    sees Dr Nicholaus Bloom  . GERD (gastroesophageal reflux disease)   . Hyperlipidemia   . Hypertension   . Hypothyroidism   . Neuromuscular disorder [facility])      Past Surgical History:  Procedure Laterality Date  . BREAST SURGERY     reduction  . CERVICAL DISC SURGERY  07/2007   C4/5 and C5/6 ([facility])  . COLONOSCOPY  01/2019   5 TAs, rpt 3 yrs Ardis Hughs)   . SHOULDER SURGERY Right 2000   Supple  . SHOULDER SURGERY Left 2008   Supple (torn labrum)  . TONSILLECTOMY      Family History  Problem Relation Age of Onset  . Arthritis Other        fhx  . Hyperlipidemia Other        strong fmhx  . Hypertension Other        strong fmhx  . Cancer Father 66       prostate  . Diabetes Paternal Grandmother   . Heart disease Paternal Grandfather        unknown  details  . Colon cancer Neg Hx   . Esophageal cancer Neg Hx   . Rectal cancer Neg Hx   . Stomach cancer Neg Hx     Social History   Tobacco Use  . Smoking status: Former Smoker    Packs/day: 0.50    Years: 33.00    Pack years: 16.50    Types: Cigarettes    Start date: 03/17/1984    Quit date: 03/18/2007    Years since quitting: 13.0  . Smokeless tobacco: Never Used  Substance Use Topics  . Alcohol use: Yes    Alcohol/week: 0.0 standard drinks    Comment: 1 drink daily  . Drug use: No    ROS   Objective:   Vitals: BP (!) 154/82 (BP Location: Left Arm)   Pulse 89   Temp 98.2 F (36.8 C) (Oral)   Resp 18   Ht 6\' 1"  (1.854 m)   Wt 220 lb (99.8 kg)   SpO2 96%   BMI 29.03 kg/m   Physical Exam Constitutional:      General: He is not in acute distress.    Appearance: Normal appearance. He is well-developed and normal weight. He is not  ill-appearing, toxic-appearing or diaphoretic.  HENT:     Head: Normocephalic and atraumatic.     Right Ear: External ear normal.     Left Ear: External ear normal.     Nose: Nose normal.     Mouth/Throat:     Pharynx: Oropharynx is clear.  Eyes:     General: No scleral icterus.       Right eye: No foreign body, discharge or hordeolum.        Left eye: No foreign body, discharge or hordeolum.     Extraocular Movements: Extraocular movements intact.     Conjunctiva/sclera:     Right eye: Right conjunctiva is not injected. No chemosis, exudate or hemorrhage.    Left eye: Left conjunctiva is not injected. No chemosis, exudate or hemorrhage.    Pupils: Pupils are equal, round, and reactive to light.   Cardiovascular:     Rate and Rhythm: Normal rate.  Pulmonary:     Effort: Pulmonary effort is normal.  Musculoskeletal:     Cervical back: Normal range of motion.  Neurological:     Mental Status: He is alert and oriented to person, place, and time.  Psychiatric:        Mood and Affect: Mood normal.        Behavior: Behavior normal.        Thought Content: Thought content normal.        Judgment: Judgment normal.      Assessment and Plan :   PDMP not reviewed this encounter.  1. Preseptal cellulitis of left upper eyelid     Start cefdinir, maintain over-the-counter Aleve for pain relief. Counseled patient on potential for adverse effects with medications prescribed/recommended today, ER and return-to-clinic precautions discussed, patient verbalized understanding.    Jaynee Eagles, PA-C 04/08/20 1147

## 2020-05-10 ENCOUNTER — Other Ambulatory Visit: Payer: Self-pay | Admitting: Family Medicine

## 2020-05-10 DIAGNOSIS — E785 Hyperlipidemia, unspecified: Secondary | ICD-10-CM

## 2020-05-10 DIAGNOSIS — E039 Hypothyroidism, unspecified: Secondary | ICD-10-CM

## 2020-05-10 DIAGNOSIS — Z1159 Encounter for screening for other viral diseases: Secondary | ICD-10-CM

## 2020-05-10 DIAGNOSIS — Z125 Encounter for screening for malignant neoplasm of prostate: Secondary | ICD-10-CM

## 2020-05-11 ENCOUNTER — Other Ambulatory Visit (INDEPENDENT_AMBULATORY_CARE_PROVIDER_SITE_OTHER): Payer: BC Managed Care – PPO

## 2020-05-11 ENCOUNTER — Other Ambulatory Visit: Payer: Self-pay

## 2020-05-11 DIAGNOSIS — Z125 Encounter for screening for malignant neoplasm of prostate: Secondary | ICD-10-CM

## 2020-05-11 DIAGNOSIS — E785 Hyperlipidemia, unspecified: Secondary | ICD-10-CM | POA: Diagnosis not present

## 2020-05-11 DIAGNOSIS — Z1159 Encounter for screening for other viral diseases: Secondary | ICD-10-CM | POA: Diagnosis not present

## 2020-05-11 DIAGNOSIS — E039 Hypothyroidism, unspecified: Secondary | ICD-10-CM

## 2020-05-11 LAB — COMPREHENSIVE METABOLIC PANEL
ALT: 19 U/L (ref 0–53)
AST: 17 U/L (ref 0–37)
Albumin: 4.2 g/dL (ref 3.5–5.2)
Alkaline Phosphatase: 49 U/L (ref 39–117)
BUN: 12 mg/dL (ref 6–23)
CO2: 33 mEq/L — ABNORMAL HIGH (ref 19–32)
Calcium: 9.7 mg/dL (ref 8.4–10.5)
Chloride: 100 mEq/L (ref 96–112)
Creatinine, Ser: 1.09 mg/dL (ref 0.40–1.50)
GFR: 78.06 mL/min (ref >60.00)
Glucose, Bld: 100 mg/dL — ABNORMAL HIGH (ref 70–99)
Potassium: 5.1 mEq/L (ref 3.5–5.1)
Sodium: 138 mEq/L (ref 135–145)
Total Bilirubin: 0.7 mg/dL (ref 0.2–1.2)
Total Protein: 7 g/dL (ref 6.0–8.3)

## 2020-05-11 LAB — TSH: TSH: 4.67 u[IU]/mL — ABNORMAL HIGH (ref 0.35–4.50)

## 2020-05-11 LAB — LIPID PANEL
Cholesterol: 200 mg/dL (ref 0–200)
HDL: 44.7 mg/dL (ref >39.00)
NonHDL: 154.88
Total CHOL/HDL Ratio: 4
Triglycerides: 208 mg/dL — ABNORMAL HIGH (ref 0.0–149.0)
VLDL: 41.6 mg/dL — ABNORMAL HIGH (ref 0.0–40.0)

## 2020-05-11 LAB — LDL CHOLESTEROL, DIRECT: Direct LDL: 117 mg/dL

## 2020-05-11 LAB — PSA: PSA: 1.26 ng/mL (ref 0.10–4.00)

## 2020-05-14 LAB — HEPATITIS C ANTIBODY
Hepatitis C Ab: NONREACTIVE
SIGNAL TO CUT-OFF: 0.04 (ref <1.00)

## 2020-05-18 ENCOUNTER — Other Ambulatory Visit: Payer: Self-pay

## 2020-05-18 ENCOUNTER — Ambulatory Visit (INDEPENDENT_AMBULATORY_CARE_PROVIDER_SITE_OTHER): Payer: BC Managed Care – PPO | Admitting: Family Medicine

## 2020-05-18 ENCOUNTER — Encounter: Payer: Self-pay | Admitting: Family Medicine

## 2020-05-18 VITALS — BP 128/62 | HR 100 | Temp 97.7°F | Ht 71.5 in | Wt 222.4 lb

## 2020-05-18 DIAGNOSIS — Z0000 Encounter for general adult medical examination without abnormal findings: Secondary | ICD-10-CM | POA: Diagnosis not present

## 2020-05-18 DIAGNOSIS — I10 Essential (primary) hypertension: Secondary | ICD-10-CM

## 2020-05-18 DIAGNOSIS — E66811 Obesity, class 1: Secondary | ICD-10-CM

## 2020-05-18 DIAGNOSIS — G894 Chronic pain syndrome: Secondary | ICD-10-CM

## 2020-05-18 DIAGNOSIS — E039 Hypothyroidism, unspecified: Secondary | ICD-10-CM | POA: Diagnosis not present

## 2020-05-18 DIAGNOSIS — E785 Hyperlipidemia, unspecified: Secondary | ICD-10-CM

## 2020-05-18 DIAGNOSIS — E669 Obesity, unspecified: Secondary | ICD-10-CM

## 2020-05-18 DIAGNOSIS — F988 Other specified behavioral and emotional disorders with onset usually occurring in childhood and adolescence: Secondary | ICD-10-CM

## 2020-05-18 DIAGNOSIS — Z23 Encounter for immunization: Secondary | ICD-10-CM | POA: Diagnosis not present

## 2020-05-18 MED ORDER — LISINOPRIL 5 MG PO TABS: 5.0000 mg | ORAL_TABLET | Freq: Every day | ORAL | 3 refills | Status: DC

## 2020-05-18 MED ORDER — ATORVASTATIN CALCIUM 40 MG PO TABS: 40.0000 mg | ORAL_TABLET | Freq: Every day | ORAL | 3 refills | Status: DC

## 2020-05-18 MED ORDER — LEVOTHYROXINE SODIUM 137 MCG PO TABS: 137.0000 ug | ORAL_TABLET | Freq: Every day | ORAL | 3 refills | Status: DC

## 2020-05-18 NOTE — Patient Instructions (Addendum)
First shingrix vaccine today. Return in 2-6 months to complete series.  Thyroid levels did return low. Increase levothyroxine to 16mcg daily. Recheck levels in 6 weeks.  You are doing well today Return as needed or in 1 year for next physical.   Health Maintenance, Male Adopting a healthy lifestyle and getting preventive care are important in promoting health and wellness. Ask your health care provider about:  The right schedule for you to have regular tests and exams.  Things you can do on your own to prevent diseases and keep yourself healthy. What should I know about diet, weight, and exercise? Eat a healthy diet  Eat a diet that includes plenty of vegetables, fruits, low-fat dairy products, and lean protein.  Do not eat a lot of foods that are high in solid fats, added sugars, or sodium.   Maintain a healthy weight Body mass index (BMI) is a measurement that can be used to identify possible weight problems. It estimates body fat based on height and weight. Your health care provider can help determine your BMI and help you achieve or maintain a healthy weight. Get regular exercise Get regular exercise. This is one of the most important things you can do for your health. Most adults should:  Exercise for at least 150 minutes each week. The exercise should increase your heart rate and make you sweat (moderate-intensity exercise).  Do strengthening exercises at least twice a week. This is in addition to the moderate-intensity exercise.  Spend less time sitting. Even light physical activity can be beneficial. Watch cholesterol and blood lipids Have your blood tested for lipids and cholesterol at 52 years of age, then have this test every 5 years. You may need to have your cholesterol levels checked more often if:  Your lipid or cholesterol levels are high.  You are older than 53 years of age.  You are at high risk for heart disease. What should I know about cancer screening? Many  types of cancers can be detected early and may often be prevented. Depending on your health history and family history, you may need to have cancer screening at various ages. This may include screening for:  Colorectal cancer.  Prostate cancer.  Skin cancer.  Lung cancer. What should I know about heart disease, diabetes, and high blood pressure? Blood pressure and heart disease  High blood pressure causes heart disease and increases the risk of stroke. This is more likely to develop in people who have high blood pressure readings, are of African descent, or are overweight.  Talk with your health care provider about your target blood pressure readings.  Have your blood pressure checked: ? Every 3-5 years if you are 52-80 years of age. ? Every year if you are 25 years old or older.  If you are between the ages of 44 and 23 and are a current or former smoker, ask your health care provider if you should have a one-time screening for abdominal aortic aneurysm (AAA). Diabetes Have regular diabetes screenings. This checks your fasting blood sugar level. Have the screening done:  Once every three years after age 68 if you are at a normal weight and have a low risk for diabetes.  More often and at a younger age if you are overweight or have a high risk for diabetes. What should I know about preventing infection? Hepatitis B If you have a higher risk for hepatitis B, you should be screened for this virus. Talk with your health care provider  to find out if you are at risk for hepatitis B infection. Hepatitis C Blood testing is recommended for:  Everyone born from [birth year] through 1963.  Anyone with known risk factors for hepatitis C. Sexually transmitted infections (STIs)  You should be screened each year for STIs, including gonorrhea and chlamydia, if: ? You are sexually active and are younger than 53 years of age. ? You are older than 53 years of age and your health care provider tells you  that you are at risk for this type of infection. ? Your sexual activity has changed since you were last screened, and you are at increased risk for chlamydia or gonorrhea. Ask your health care provider if you are at risk.  Ask your health care provider about whether you are at high risk for HIV. Your health care provider may recommend a prescription medicine to help prevent HIV infection. If you choose to take medicine to prevent HIV, you should first get tested for HIV. You should then be tested every 3 months for as long as you are taking the medicine. Follow these instructions at home: Lifestyle  Do not use any products that contain nicotine or tobacco, such as cigarettes, e-cigarettes, and chewing tobacco. If you need help quitting, ask your health care provider.  Do not use street drugs.  Do not share needles.  Ask your health care provider for help if you need support or information about quitting drugs. Alcohol use  Do not drink alcohol if your health care provider tells you not to drink.  If you drink alcohol: ? Limit how much you have to 0-2 drinks a day. ? Be aware of how much alcohol is in your drink. In the [place] one drink equals one 12 oz bottle of beer (355 mL), one 5 oz glass of wine (148 mL), or one 1 oz glass of hard liquor (44 mL). General instructions  Schedule regular health, dental, and eye exams.  Stay current with your vaccines.  Tell your health care provider if: ? You often feel depressed. ? You have ever been abused or do not feel safe at home. Summary  Adopting a healthy lifestyle and getting preventive care are important in promoting health and wellness.  Follow your health care provider's instructions about healthy diet, exercising, and getting tested or screened for diseases.  Follow your health care provider's instructions on monitoring your cholesterol and blood pressure. This information is not intended to replace advice given to you by your  health care provider. Make sure you discuss any questions you have with your health care provider. Document Revised: 02/24/2018 Document Reviewed: 02/24/2018 Elsevier Patient Education  2021 [employer].

## 2020-05-18 NOTE — Assessment & Plan Note (Signed)
Encouraged healthy diet and lifestyle choices to affect sustainable weight loss.  ?

## 2020-05-18 NOTE — Assessment & Plan Note (Signed)
Chronic, stable on atorvastatin. Continue  The 10-year ASCVD risk score Chase Bussing DC Jr., et al., 2013) is: 5.5%   Values used to calculate the score:     Age: 55 years     Sex: Male     Is Non-Hispanic African American: No     Diabetic: No     Tobacco smoker: No     Systolic Blood Pressure: 770 mmHg     Is BP treated: Yes     HDL Cholesterol: 44.7 mg/dL     Total Cholesterol: 200 mg/dL

## 2020-05-18 NOTE — Assessment & Plan Note (Signed)
Chronic, stable. Continue lisinopril.  

## 2020-05-18 NOTE — Assessment & Plan Note (Signed)
Followed by Dr [facility]

## 2020-05-18 NOTE — Assessment & Plan Note (Signed)
Chronic, stable on Concerta 36mg  daily - continue. Consider updated UDS next visit.

## 2020-05-18 NOTE — Progress Notes (Signed)
Patient ID: Chase Burke, male    DOB: [birth year], 55 y.o.   MRN: [id number]  This visit was conducted in person.  BP 128/62   Pulse 100   Temp 97.7 F (36.5 C) (Temporal)   Ht 5' 11.5" (1.816 m)   Wt 222 lb 6 oz (100.9 kg)   SpO2 97%   BMI 30.58 kg/m    CC: CPE Subjective:   HPI: Chase Burke is a 55 y.o. male presenting on 05/18/2020 for Annual Exam   Pain management through Dr Hardin Negus.  ADD on Concerta 36mg  daily. Stable period, effective. Tolerating well, no HA, insomnia, chest pain, anorexia.  Seen 03/2020 with many hypothyroid symptoms however levels returned normal. On repeat testing last week, TSH elevated to 4.67 - this time he held biotin for 1 week.   Tachycardic - just finish working out.   Preventative: Colonoscopy 01/2019 - 5 TAs, rpt 3 yrs Ardis Hughs)  Prostate cancer screening - discussed, would like screening given fmhx (father s/p radiation).PSA yearly, DRE QOY (done 2022) Lung cancer screen -not eligible Flu shot - declines- h/o bad reaction with arm swelling to previous flu shot  COVID vaccine Moderna 05/2019, 06/2019, booster with Pfizer 02/2020 Hep A series today Tdap 2014 zostavax 2014  shingrix - first dose today  Seat belt use discussed  Sunscreens use discussed, no changing moles on skin  Ex-smoker (2009)  Alcohol - 2-3 beers daily Dentist yearly  Eye exam - due  Lives with wife and son, no pets  Edu: PhD(Econ and Stats) - working on Scientist, water quality in math  Occ: Economy Professor at [employer]  Activity: enjoys disc golf on weekends, 3d/wk Diet:good water,fruits/vegetable daily      Relevant past medical, surgical, family and social history reviewed and updated as indicated. Interim medical history since our last visit reviewed. Allergies and medications reviewed and updated. Outpatient Medications Prior to Visit  Medication Sig Dispense Refill  . aspirin EC 81 MG tablet Take 81 mg by mouth daily.    . buprenorphine (BUTRANS) 15 MCG/HR 1  patch once a week.    . cetirizine (ZYRTEC) 10 MG tablet Take 10 mg by mouth daily.    . cyclobenzaprine (FLEXERIL) 10 MG tablet 10 mg. Takes 0.5-1 tablet 3 times daily as needed.  Takes 0.5 tablet at bedtime    . diphenhydrAMINE (BENADRYL) 25 MG tablet Take 25-50 mg by mouth at bedtime as needed for sleep.     . fluticasone (FLONASE) 50 MCG/ACT nasal spray Place 2 sprays into both nostrils daily. (Patient taking differently: Place 2 sprays into both nostrils daily. As needed) 16 g 6  . Glucos-MSM-C-Mn-Ginger-Willow (GLUCOSAMINE MSM COMPLEX PO) Take 1 tablet by mouth daily.    . Melatonin 3 MG TABS Take 3 mg by mouth at bedtime.    . METHYLPHENIDATE 36 MG PO CR tablet TAKE 1 TABLET BY MOUTH DAILY. 90 tablet 0  . Multiple Vitamin (MULTIVITAMIN) tablet Take 1 tablet by mouth daily.    . naproxen (NAPROSYN) 250 MG tablet Take 250 mg by mouth 2 (two) times daily with a meal. Takes 220 mg    . atorvastatin (LIPITOR) 40 MG tablet Take 1 tablet (40 mg total) by mouth daily. 90 tablet 3  . cefdinir (OMNICEF) 300 MG capsule Take 1 capsule (300 mg total) by mouth 2 (two) times daily. 20 capsule 0  . levothyroxine (SYNTHROID) 125 MCG tablet Take 1 tablet (125 mcg total) by mouth daily before breakfast. 90 tablet 3  .  lisinopril (ZESTRIL) 5 MG tablet Take 1 tablet (5 mg total) by mouth daily. 90 tablet 3  . buprenorphine (BUTRANS) 10 MCG/HR PTWK Place 1 patch onto the skin once a week.     No facility-administered medications prior to visit.     Per HPI unless specifically indicated in ROS section below Review of Systems  Constitutional: Negative for activity change, appetite change, chills, fatigue, fever and unexpected weight change.  HENT: Negative for hearing loss.   Eyes: Negative for visual disturbance.  Respiratory: Negative for cough, chest tightness, shortness of breath and wheezing.   Cardiovascular: Negative for chest pain, palpitations and leg swelling.  Gastrointestinal: Negative for  abdominal distention, abdominal pain, blood in stool, constipation, diarrhea, nausea and vomiting.  Genitourinary: Negative for difficulty urinating and hematuria.  Musculoskeletal: Negative for arthralgias, myalgias and neck pain.  Skin: Negative for rash.  Neurological: Negative for dizziness, seizures, syncope and headaches.  Hematological: Negative for adenopathy. Does not bruise/bleed easily.  Psychiatric/Behavioral: Negative for dysphoric mood. The patient is not nervous/anxious.    Objective:  BP 128/62   Pulse 100   Temp 97.7 F (36.5 C) (Temporal)   Ht 5' 11.5" (1.816 m)   Wt 222 lb 6 oz (100.9 kg)   SpO2 97%   BMI 30.58 kg/m   Wt Readings from Last 3 Encounters:  05/18/20 222 lb 6 oz (100.9 kg)  04/08/20 220 lb (99.8 kg)  03/30/20 221 lb 2 oz (100.3 kg)      Physical Exam Vitals and nursing note reviewed.  Constitutional:      General: He is not in acute distress.    Appearance: Normal appearance. He is well-developed and well-nourished. He is not ill-appearing.  HENT:     Head: Normocephalic and atraumatic.     Right Ear: Hearing, tympanic membrane, ear canal and external ear normal.     Left Ear: Hearing, tympanic membrane, ear canal and external ear normal.     Mouth/Throat:     Mouth: Oropharynx is clear and moist and mucous membranes are normal.     Pharynx: No posterior oropharyngeal edema.  Eyes:     General: No scleral icterus.    Extraocular Movements: Extraocular movements intact and EOM normal.     Conjunctiva/sclera: Conjunctivae normal.     Pupils: Pupils are equal, round, and reactive to light.  Neck:     Thyroid: No thyroid mass or thyromegaly.  Cardiovascular:     Rate and Rhythm: Normal rate and regular rhythm.     Pulses: Normal pulses and intact distal pulses.          Radial pulses are 2+ on the right side and 2+ on the left side.     Heart sounds: Normal heart sounds. No murmur heard.   Pulmonary:     Effort: Pulmonary effort is  normal. No respiratory distress.     Breath sounds: Normal breath sounds. No wheezing, rhonchi or rales.  Abdominal:     General: Abdomen is flat. Bowel sounds are normal. There is no distension.     Palpations: Abdomen is soft. There is no mass.     Tenderness: There is no abdominal tenderness. There is no guarding or rebound.     Hernia: No hernia is present.  Genitourinary:    Prostate: Enlarged (mild). Not tender and no nodules present.     Rectum: Normal. No mass, tenderness, anal fissure, external hemorrhoid or internal hemorrhoid. Normal anal tone.  Musculoskeletal:  General: No edema. Normal range of motion.     Cervical back: Normal range of motion and neck supple.     Right lower leg: No edema.     Left lower leg: No edema.  Lymphadenopathy:     Cervical: No cervical adenopathy.  Skin:    General: Skin is warm and dry.     Findings: No rash.  Neurological:     General: No focal deficit present.     Mental Status: He is alert and oriented to person, place, and time.     Comments: CN grossly intact, station and gait intact  Psychiatric:        Mood and Affect: Mood and affect and mood normal.        Behavior: Behavior normal.        Thought Content: Thought content normal.        Judgment: Judgment normal.       Results for orders placed or performed in visit on 05/11/20  Hepatitis C antibody  Result Value Ref Range   Hepatitis C Ab NON-REACTIVE NON-REACTI   SIGNAL TO CUT-OFF 0.04 <1.00  PSA  Result Value Ref Range   PSA 1.26 0.10 - 4.00 ng/mL  TSH  Result Value Ref Range   TSH 4.67 (H) 0.35 - 4.50 uIU/mL  Comprehensive metabolic panel  Result Value Ref Range   Sodium 138 135 - 145 mEq/L   Potassium 5.1 3.5 - 5.1 mEq/L   Chloride 100 96 - 112 mEq/L   CO2 33 (H) 19 - 32 mEq/L   Glucose, Bld 100 (H) 70 - 99 mg/dL   BUN 12 6 - 23 mg/dL   Creatinine, Ser 1.09 0.40 - 1.50 mg/dL   Total Bilirubin 0.7 0.2 - 1.2 mg/dL   Alkaline Phosphatase 49 39 - 117 U/L    AST 17 0 - 37 U/L   ALT 19 0 - 53 U/L   Total Protein 7.0 6.0 - 8.3 g/dL   Albumin 4.2 3.5 - 5.2 g/dL   GFR 78.06 >60.00 mL/min   Calcium 9.7 8.4 - 10.5 mg/dL  Lipid panel  Result Value Ref Range   Cholesterol 200 0 - 200 mg/dL   Triglycerides 208.0 (H) 0.0 - 149.0 mg/dL   HDL 44.70 >39.00 mg/dL   VLDL 41.6 (H) 0.0 - 40.0 mg/dL   Total CHOL/HDL Ratio 4    NonHDL 154.88   LDL cholesterol, direct  Result Value Ref Range   Direct LDL 117.0 mg/dL   Assessment & Plan:  This visit occurred during the SARS-CoV-2 public health emergency.  Safety protocols were in place, including screening questions prior to the visit, additional usage of staff PPE, and extensive cleaning of exam room while observing appropriate contact time as indicated for disinfecting solutions.   Problem List Items Addressed This Visit    Obesity, Class I, BMI 30.0-34.9 (see actual BMI)    Encouraged healthy diet and lifestyle choices to affect sustainable weight loss.       Hypothyroidism    TSH elevated after he held biotin x1 wk prior to labs - associated with ongoing hypothyroid symptoms. Will increase levothyroxine dose to 116mcg and reassess in 4-6 wks.       Relevant Medications   levothyroxine (SYNTHROID) 137 MCG tablet   Hyperlipidemia    Chronic, stable on atorvastatin. Continue  The 10-year ASCVD risk score Mikey Bussing DC Jr., et al., 2013) is: 5.5%   Values used to calculate the score:     Age:  66 years     Sex: Male     Is Non-Hispanic African American: No     Diabetic: No     Tobacco smoker: No     Systolic Blood Pressure: 790 mmHg     Is BP treated: Yes     HDL Cholesterol: 44.7 mg/dL     Total Cholesterol: 200 mg/dL       Relevant Medications   atorvastatin (LIPITOR) 40 MG tablet   lisinopril (ZESTRIL) 5 MG tablet   Health maintenance examination - Primary    Preventative protocols reviewed and updated unless pt declined. Discussed healthy diet and lifestyle.       Essential  hypertension, benign    Chronic, stable. Continue lisinopril.       Relevant Medications   atorvastatin (LIPITOR) 40 MG tablet   lisinopril (ZESTRIL) 5 MG tablet   Chronic pain syndrome    [facility]       Relevant Medications   buprenorphine (BUTRANS) 15 MCG/HR   Attention deficit disorder without hyperactivity    Chronic, stable on Concerta 36mg  daily - continue. Consider updated UDS next visit.        Other Visit Diagnoses    Need for shingles vaccine       Relevant Orders   Varicella-zoster vaccine IM (Completed)       Meds ordered this encounter  Medications  . levothyroxine (SYNTHROID) 137 MCG tablet    Sig: Take 1 tablet (137 mcg total) by mouth daily before breakfast.    Dispense:  30 tablet    Refill:  3  . atorvastatin (LIPITOR) 40 MG tablet    Sig: Take 1 tablet (40 mg total) by mouth daily.    Dispense:  90 tablet    Refill:  3  . lisinopril (ZESTRIL) 5 MG tablet    Sig: Take 1 tablet (5 mg total) by mouth daily.    Dispense:  90 tablet    Refill:  3   Orders Placed This Encounter  Procedures  . Varicella-zoster vaccine IM    Patient instructions: First shingrix vaccine today. Return in 2-6 months to complete series.  Thyroid levels did return low. Increase levothyroxine to 114mcg daily. Recheck levels in 6 weeks.  You are doing well today Return as needed or in 1 year for next physical.   Follow up plan: Return in about 1 year (around 05/18/2021) for annual exam, prior fasting for blood work.  Ria Bush, MD

## 2020-05-18 NOTE — Assessment & Plan Note (Signed)
Preventative protocols reviewed and updated unless pt declined. Discussed healthy diet and lifestyle.  

## 2020-05-18 NOTE — Assessment & Plan Note (Signed)
TSH elevated after he held biotin x1 wk prior to labs - associated with ongoing hypothyroid symptoms. Will increase levothyroxine dose to 116mcg and reassess in 4-6 wks.

## 2020-06-11 ENCOUNTER — Encounter: Payer: Self-pay | Admitting: Family Medicine

## 2020-06-11 NOTE — Telephone Encounter (Signed)
Per Dr. Darnell Level, 06/15/20 appt should be lab visit only.  Relayed info to pt via MyChart.  Pt verbalizes understanding.    Changed visit to lab visit on 06/15/20 at 11:00.  Pt aware.

## 2020-06-14 ENCOUNTER — Other Ambulatory Visit: Payer: Self-pay | Admitting: Family Medicine

## 2020-06-14 DIAGNOSIS — E039 Hypothyroidism, unspecified: Secondary | ICD-10-CM

## 2020-06-15 ENCOUNTER — Other Ambulatory Visit (INDEPENDENT_AMBULATORY_CARE_PROVIDER_SITE_OTHER): Payer: BC Managed Care – PPO

## 2020-06-15 ENCOUNTER — Other Ambulatory Visit: Payer: Self-pay

## 2020-06-15 ENCOUNTER — Ambulatory Visit: Payer: BC Managed Care – PPO | Admitting: Family Medicine

## 2020-06-15 DIAGNOSIS — E039 Hypothyroidism, unspecified: Secondary | ICD-10-CM | POA: Diagnosis not present

## 2020-06-15 LAB — TSH: TSH: 1.37 u[IU]/mL (ref 0.35–4.50)

## 2020-06-15 LAB — T4, FREE: Free T4: 1.23 ng/dL (ref 0.60–1.60)

## 2020-06-18 ENCOUNTER — Other Ambulatory Visit: Payer: Self-pay | Admitting: Family Medicine

## 2020-06-18 ENCOUNTER — Encounter: Payer: Self-pay | Admitting: Family Medicine

## 2020-06-18 DIAGNOSIS — F988 Other specified behavioral and emotional disorders with onset usually occurring in childhood and adolescence: Secondary | ICD-10-CM

## 2020-06-18 DIAGNOSIS — E039 Hypothyroidism, unspecified: Secondary | ICD-10-CM

## 2020-06-19 MED ORDER — LEVOTHYROXINE SODIUM 137 MCG PO TABS: 137.0000 ug | ORAL_TABLET | Freq: Every day | ORAL | 0 refills | Status: DC

## 2020-06-19 NOTE — Telephone Encounter (Signed)
E-scribed 90-day rx to [facility].

## 2020-06-19 NOTE — Telephone Encounter (Signed)
Name of Medication: Methylphenidate Name of Pharmacy: [facility] or Written Date and Quantity: 12/12/19, #90 Last Office Visit and Type: 05/18/20, CPE Next Office Visit and Type: none Last Controlled Substance Agreement Date: 02/09/17 Last UDS: 04/07/18

## 2020-06-20 MED ORDER — METHYLPHENIDATE HCL ER (OSM) 36 MG PO TBCR: 36.0000 mg | EXTENDED_RELEASE_TABLET | Freq: Every day | ORAL | 0 refills | Status: DC

## 2020-06-20 NOTE — Addendum Note (Signed)
Addended by: Ria Bush on: 06/20/2020 02:04 PM   Modules accepted: Orders

## 2020-06-20 NOTE — Telephone Encounter (Signed)
ERx methylphenidate

## 2020-08-06 ENCOUNTER — Encounter: Payer: Self-pay | Admitting: Family Medicine

## 2020-08-21 ENCOUNTER — Ambulatory Visit: Payer: BC Managed Care – PPO

## 2020-09-04 ENCOUNTER — Ambulatory Visit (INDEPENDENT_AMBULATORY_CARE_PROVIDER_SITE_OTHER): Payer: BC Managed Care – PPO | Admitting: *Deleted

## 2020-09-04 ENCOUNTER — Other Ambulatory Visit: Payer: Self-pay

## 2020-09-04 DIAGNOSIS — Z23 Encounter for immunization: Secondary | ICD-10-CM

## 2020-09-04 NOTE — Progress Notes (Signed)
Per orders of Dr. Danise Mina, injection of 2nd Shingrix given by Lauralyn Primes. Patient tolerated injection well.

## 2020-09-12 ENCOUNTER — Other Ambulatory Visit: Payer: Self-pay | Admitting: Family Medicine

## 2020-09-12 DIAGNOSIS — F988 Other specified behavioral and emotional disorders with onset usually occurring in childhood and adolescence: Secondary | ICD-10-CM

## 2020-09-13 NOTE — Telephone Encounter (Signed)
Name of Medication: Methylphenidate Name of Pharmacy: [facility] or Written Date and Quantity: 12/12/19, #90 Last Office Visit and Type: 05/18/20, CPE Next Office Visit and Type: none Last Controlled Substance Agreement Date: 02/09/17 Last UDS: 04/07/18

## 2020-09-14 ENCOUNTER — Other Ambulatory Visit: Payer: Self-pay | Admitting: Family Medicine

## 2020-09-14 DIAGNOSIS — F988 Other specified behavioral and emotional disorders with onset usually occurring in childhood and adolescence: Secondary | ICD-10-CM

## 2020-09-14 NOTE — Telephone Encounter (Signed)
Duplicate request

## 2020-09-14 NOTE — Telephone Encounter (Signed)
ERx 

## 2020-10-30 ENCOUNTER — Encounter: Payer: Self-pay | Admitting: Family Medicine

## 2020-10-30 ENCOUNTER — Telehealth: Payer: Self-pay

## 2020-10-30 ENCOUNTER — Telehealth (INDEPENDENT_AMBULATORY_CARE_PROVIDER_SITE_OTHER): Payer: BC Managed Care – PPO | Admitting: Family Medicine

## 2020-10-30 VITALS — BP 133/78 | HR 95 | Temp 99.0°F | Ht 71.5 in | Wt 222.0 lb

## 2020-10-30 DIAGNOSIS — U071 COVID-19: Secondary | ICD-10-CM | POA: Insufficient documentation

## 2020-10-30 DIAGNOSIS — I10 Essential (primary) hypertension: Secondary | ICD-10-CM

## 2020-10-30 DIAGNOSIS — E669 Obesity, unspecified: Secondary | ICD-10-CM | POA: Diagnosis not present

## 2020-10-30 HISTORY — DX: COVID-19: U07.1

## 2020-10-30 MED ORDER — NIRMATRELVIR/RITONAVIR (PAXLOVID)TABLET: 3.0000 | ORAL_TABLET | Freq: Two times a day (BID) | ORAL | 0 refills | Status: AC

## 2020-10-30 NOTE — Progress Notes (Signed)
Patient ID: Chase Burke, male    DOB: 12-Oct-1965, 55 y.o.   MRN: [id number]  Virtual visit completed through [place], a video enabled telemedicine application. Due to national recommendations of social distancing due to COVID-19, a virtual visit is felt to be most appropriate for this patient at this time. Reviewed limitations, risks, security and privacy concerns of performing a virtual visit and the availability of in person appointments. I also reviewed that there may be a patient responsible charge related to this service. The patient agreed to proceed.   Patient location: home Provider location: [place] at [facility], office Persons participating in this virtual visit: patient, provider   If any vitals were documented, they were collected by patient at home unless specified below.    BP 133/78   Pulse 95   Temp 99 F (37.2 C)   Ht 5' 11.5" (1.816 m)   Wt 222 lb (100.7 kg)   SpO2 98%   BMI 30.53 kg/m    CC: COVID infection Subjective:   HPI: Chase Burke is a 55 y.o. male presenting on 10/30/2020 for Cough (C/o cough, diarrhea, chest tightness, SOB, fever- max 101, S/T, body aches and runny nose.  Sxs started on 10/28/20. Tried Tylenol. Pos home COVID test on 10/29/20.  Had 3 doses of COVID vaccine, latest Pfizer on 02/28/20.)   First day of symptoms: 10/28/2020 Tested COVID positive: 10/29/2020  Current symptoms: dry cough, diarrhea, fever Tmax 101, ST, body aches and rhinorrhea. Some chest tightness sensation without chest pain or significant shortness of breath.  No: loss of taste, smell, abd pain, nausea.  Treatments to date: tylenol  Risk factors include: age, HTN, obesity BMI 30  COVID vaccination status: 3 doses total Pfizer      Relevant past medical, surgical, family and social history reviewed and updated as indicated. Interim medical history since our last visit reviewed. Allergies and medications reviewed and updated. Outpatient Medications Prior to Visit   Medication Sig Dispense Refill   aspirin EC 81 MG tablet Take 81 mg by mouth daily.     atorvastatin (LIPITOR) 40 MG tablet Take 1 tablet (40 mg total) by mouth daily. 90 tablet 3   buprenorphine (BUTRANS) 15 MCG/HR 1 patch once a week.     cetirizine (ZYRTEC) 10 MG tablet Take 10 mg by mouth daily.     cyclobenzaprine (FLEXERIL) 10 MG tablet 10 mg. Takes 0.5-1 tablet 3 times daily as needed.  Takes 0.5 tablet at bedtime     diphenhydrAMINE (BENADRYL) 25 MG tablet Take 25-50 mg by mouth at bedtime as needed for sleep.      fluticasone (FLONASE) 50 MCG/ACT nasal spray Place 2 sprays into both nostrils daily. (Patient taking differently: Place 2 sprays into both nostrils daily. As needed) 16 g 6   Glucos-MSM-C-Mn-Ginger-Willow (GLUCOSAMINE MSM COMPLEX PO) Take 1 tablet by mouth daily.     levothyroxine (SYNTHROID) 137 MCG tablet Take 1 tablet (137 mcg total) by mouth daily before breakfast. 90 tablet 0   lisinopril (ZESTRIL) 5 MG tablet Take 1 tablet (5 mg total) by mouth daily. 90 tablet 3   Melatonin 3 MG TABS Take 3 mg by mouth at bedtime.     METHYLPHENIDATE 36 MG PO CR tablet TAKE 1 TABLET (36 MG TOTAL) BY MOUTH DAILY. 90 tablet 0   Multiple Vitamin (MULTIVITAMIN) tablet Take 1 tablet by mouth daily.     naproxen (NAPROSYN) 250 MG tablet Take 250 mg by mouth 2 (two) times daily with  a meal. Takes 220 mg     No facility-administered medications prior to visit.     Per HPI unless specifically indicated in ROS section below Review of Systems Objective:  BP 133/78   Pulse 95   Temp 99 F (37.2 C)   Ht 5' 11.5" (1.816 m)   Wt 222 lb (100.7 kg)   SpO2 98%   BMI 30.53 kg/m   Wt Readings from Last 3 Encounters:  10/30/20 222 lb (100.7 kg)  05/18/20 222 lb 6 oz (100.9 kg)  04/08/20 220 lb (99.8 kg)       Physical exam: Gen: alert, NAD, not ill appearing Pulm: speaks in complete sentences without increased work of breathing Psych: normal mood, normal thought content      Lab  Results  Component Value Date   CREATININE 1.09 05/11/2020   BUN 12 05/11/2020   NA 138 05/11/2020   K 5.1 05/11/2020   CL 100 05/11/2020   CO2 33 (H) 05/11/2020    Assessment & Plan:   Problem List Items Addressed This Visit     Essential hypertension, benign   Obesity, Class I, BMI 30.0-34.9 (see actual BMI)   COVID-19 virus infection - Primary    Reviewed currently approved EUA treatments.  Reviewed expected course of illness, anticipated course of recovery, as well as red flags to suggested COVID pneumonia or to seek urgent in-person care. Reviewed latest CDC isolation/quarantining guidelines.  Encouraged fluids and rest. Reviewed further supportive care measures at home including vit C '500mg'$  bid, vit D 2000 IU daily, zinc '100mg'$  daily, tylenol PRN.  Recommend:  Full dose paxlovid GFR 78. Paxlovid drug interactions:  1. Atorvastatin - hold while on paxlovid 2. Buprenorphine - discussed caution as it can increase concentrations of opiate - he will check with pain clinic for further recommendations prior to starting 3. flonase - hold while on paxlovid      Relevant Medications   nirmatrelvir/ritonavir EUA (PAXLOVID) TABS     Meds ordered this encounter  Medications   nirmatrelvir/ritonavir EUA (PAXLOVID) TABS    Sig: Take 3 tablets by mouth 2 (two) times daily for 5 days. (Take nirmatrelvir 150 mg two tablets twice daily for 5 days and ritonavir 100 mg one tablet twice daily for 5 days) Patient GFR is 78    Dispense:  30 tablet    Refill:  0   No orders of the defined types were placed in this encounter.   I discussed the assessment and treatment plan with the patient. The patient was provided an opportunity to ask questions and all were answered. The patient agreed with the plan and demonstrated an understanding of the instructions. The patient was advised to call back or seek an in-person evaluation if the symptoms worsen or if the condition fails to improve as  anticipated.  Follow up plan: No follow-ups on file.  Ria Bush, MD

## 2020-10-30 NOTE — Telephone Encounter (Signed)
Chase Burke at front desk brought me a Pharmacist, community note that pt tested + for covid on 10/29/20 with home test. Starting on 10/28/20 watery diarrhea,last night fever 101; this AM temp is 99. Pt took tylenol 500 mg taking 2 tabs last night.pt having chills, body aches, dry cough and chest is feeling tight; sometimes pt has prod cough with ? Color of phlegm. Pt has had SOB; harder to breathe than normal,runny nose and S/T that feels swollen and tight to swallow. Not normal swallowing ability this morning. Pt said in my chart note was error that pulse ox has been 97%.Pt does not have CP,no vomiting or H/A, no wheezing or loss of taste or smell. Pt does not want to go to UC or ED. I spoke with Dr Darnell Level and Dr Darnell Level will do video visit with pt today at 12:30. Self quarantine, drink plenty of fluids, rest, and take Tylenol for fever. UC & ED precautions given and pt voiced understanding. Sending note to Dr Darnell Level and Lattie Haw CMA. pt will have vitals ready when [facility] CMA calls.

## 2020-10-30 NOTE — Telephone Encounter (Signed)
Pt had MyChart visit today at 12:30 with Dr. Darnell Level.

## 2020-10-30 NOTE — Assessment & Plan Note (Addendum)
Reviewed currently approved EUA treatments.  Reviewed expected course of illness, anticipated course of recovery, as well as red flags to suggested COVID pneumonia or to seek urgent in-person care. Reviewed latest CDC isolation/quarantining guidelines.  Encouraged fluids and rest. Reviewed further supportive care measures at home including vit C '500mg'$  bid, vit D 2000 IU daily, zinc '100mg'$  daily, tylenol PRN.  Recommend:  Full dose paxlovid GFR 78. Paxlovid drug interactions:  1. Atorvastatin - hold while on paxlovid 2. Buprenorphine - discussed caution as it can increase concentrations of opiate - he will check with pain clinic for further recommendations prior to starting 3. flonase - hold while on paxlovid

## 2020-10-31 ENCOUNTER — Telehealth: Payer: BC Managed Care – PPO | Admitting: Family Medicine

## 2020-11-12 ENCOUNTER — Other Ambulatory Visit: Payer: Self-pay | Admitting: Family Medicine

## 2020-11-12 DIAGNOSIS — E039 Hypothyroidism, unspecified: Secondary | ICD-10-CM

## 2020-12-10 ENCOUNTER — Encounter: Payer: Self-pay | Admitting: Family Medicine

## 2020-12-12 NOTE — Telephone Encounter (Signed)
Noted  

## 2020-12-13 ENCOUNTER — Other Ambulatory Visit: Payer: Self-pay | Admitting: Family Medicine

## 2020-12-13 DIAGNOSIS — F988 Other specified behavioral and emotional disorders with onset usually occurring in childhood and adolescence: Secondary | ICD-10-CM

## 2020-12-13 NOTE — Telephone Encounter (Signed)
ERx 

## 2020-12-14 NOTE — Telephone Encounter (Signed)
Dr. Darnell Level sent refill today.  I attempted to submit PA.  Message states it is too early.

## 2021-03-11 ENCOUNTER — Other Ambulatory Visit: Payer: Self-pay | Admitting: Family Medicine

## 2021-03-11 DIAGNOSIS — F988 Other specified behavioral and emotional disorders with onset usually occurring in childhood and adolescence: Secondary | ICD-10-CM

## 2021-03-12 NOTE — Telephone Encounter (Signed)
Last OV- 05/18/2020 Next OV- N/A Last Filled- 12/13/2020

## 2021-03-13 ENCOUNTER — Encounter: Payer: Self-pay | Admitting: Family Medicine

## 2021-03-13 DIAGNOSIS — F988 Other specified behavioral and emotional disorders with onset usually occurring in childhood and adolescence: Secondary | ICD-10-CM

## 2021-03-13 NOTE — Telephone Encounter (Signed)
ERx 

## 2021-03-14 MED ORDER — METHYLPHENIDATE HCL ER (OSM) 36 MG PO TBCR: 36.0000 mg | EXTENDED_RELEASE_TABLET | Freq: Every day | ORAL | 0 refills | Status: DC

## 2021-05-06 ENCOUNTER — Other Ambulatory Visit: Payer: Self-pay | Admitting: Family Medicine

## 2021-05-06 DIAGNOSIS — I10 Essential (primary) hypertension: Secondary | ICD-10-CM

## 2021-05-06 DIAGNOSIS — E785 Hyperlipidemia, unspecified: Secondary | ICD-10-CM

## 2021-06-08 ENCOUNTER — Other Ambulatory Visit: Payer: Self-pay | Admitting: Family Medicine

## 2021-06-08 DIAGNOSIS — F988 Other specified behavioral and emotional disorders with onset usually occurring in childhood and adolescence: Secondary | ICD-10-CM

## 2021-06-11 NOTE — Telephone Encounter (Signed)
Refill request Methylphenidate ?Last refill 03/14/21 #90 ?Last office visit video 10/30/20 ?Upcoming appointment 07/29/21 ?

## 2021-06-12 ENCOUNTER — Encounter: Payer: Self-pay | Admitting: Family Medicine

## 2021-06-12 DIAGNOSIS — F988 Other specified behavioral and emotional disorders with onset usually occurring in childhood and adolescence: Secondary | ICD-10-CM

## 2021-06-12 MED ORDER — METHYLPHENIDATE HCL ER (OSM) 36 MG PO TBCR: 36.0000 mg | EXTENDED_RELEASE_TABLET | Freq: Every day | ORAL | 0 refills | Status: DC

## 2021-06-12 NOTE — Telephone Encounter (Signed)
ERx 

## 2021-06-12 NOTE — Telephone Encounter (Signed)
See 06/08/21 Refill request.  ?

## 2021-06-17 MED ORDER — METHYLPHENIDATE HCL ER (OSM) 36 MG PO TBCR: 36.0000 mg | EXTENDED_RELEASE_TABLET | Freq: Every day | ORAL | 0 refills | Status: DC

## 2021-06-17 NOTE — Addendum Note (Signed)
Addended by: Ria Bush on: 06/17/2021 03:10 PM ? ? Modules accepted: Orders ? ?

## 2021-06-17 NOTE — Telephone Encounter (Signed)
ERx to [facility] ?

## 2021-06-17 NOTE — Telephone Encounter (Signed)
Patient calling to see if he can get the script sen to [address] ? ?They have 14, informed patient that we see the message that he sent over also ? ? ?

## 2021-06-20 ENCOUNTER — Telehealth: Payer: Self-pay

## 2021-06-20 DIAGNOSIS — E785 Hyperlipidemia, unspecified: Secondary | ICD-10-CM

## 2021-06-20 DIAGNOSIS — I10 Essential (primary) hypertension: Secondary | ICD-10-CM

## 2021-06-20 DIAGNOSIS — E039 Hypothyroidism, unspecified: Secondary | ICD-10-CM

## 2021-06-20 MED ORDER — ATORVASTATIN CALCIUM 40 MG PO TABS: 40.0000 mg | ORAL_TABLET | Freq: Every day | ORAL | 0 refills | Status: DC

## 2021-06-20 MED ORDER — LISINOPRIL 5 MG PO TABS: 5.0000 mg | ORAL_TABLET | Freq: Every day | ORAL | 0 refills | Status: DC

## 2021-06-20 MED ORDER — LEVOTHYROXINE SODIUM 137 MCG PO TABS: 137.0000 ug | ORAL_TABLET | Freq: Every day | ORAL | 0 refills | Status: DC

## 2021-06-20 NOTE — Telephone Encounter (Signed)
E-scribed refills.  

## 2021-06-26 MED ORDER — METHYLPHENIDATE HCL ER (OSM) 36 MG PO TBCR: 36.0000 mg | EXTENDED_RELEASE_TABLET | Freq: Every day | ORAL | 0 refills | Status: DC

## 2021-06-26 NOTE — Addendum Note (Signed)
Addended by: Ria Bush on: 06/26/2021 09:47 AM ? ? Modules accepted: Orders ? ?

## 2021-07-02 MED ORDER — METHYLPHENIDATE HCL ER (XR) 30 MG PO CP24: 1.0000 | ORAL_CAPSULE | Freq: Every day | ORAL | 0 refills | Status: DC

## 2021-07-02 MED ORDER — METHYLPHENIDATE HCL ER (OSM) 36 MG PO TBCR: 36.0000 mg | EXTENDED_RELEASE_TABLET | Freq: Every day | ORAL | 0 refills | Status: DC

## 2021-07-02 NOTE — Addendum Note (Signed)
Addended by: Ria Bush on: 07/02/2021 06:09 PM ? ? Modules accepted: Orders ? ?

## 2021-07-18 ENCOUNTER — Other Ambulatory Visit: Payer: Self-pay | Admitting: Family Medicine

## 2021-07-18 DIAGNOSIS — E039 Hypothyroidism, unspecified: Secondary | ICD-10-CM

## 2021-07-18 DIAGNOSIS — Z125 Encounter for screening for malignant neoplasm of prostate: Secondary | ICD-10-CM

## 2021-07-18 DIAGNOSIS — E785 Hyperlipidemia, unspecified: Secondary | ICD-10-CM

## 2021-07-22 ENCOUNTER — Other Ambulatory Visit (INDEPENDENT_AMBULATORY_CARE_PROVIDER_SITE_OTHER): Payer: BC Managed Care – PPO

## 2021-07-22 DIAGNOSIS — Z125 Encounter for screening for malignant neoplasm of prostate: Secondary | ICD-10-CM | POA: Diagnosis not present

## 2021-07-22 DIAGNOSIS — E785 Hyperlipidemia, unspecified: Secondary | ICD-10-CM

## 2021-07-22 DIAGNOSIS — E039 Hypothyroidism, unspecified: Secondary | ICD-10-CM | POA: Diagnosis not present

## 2021-07-22 LAB — LIPID PANEL
Cholesterol: 183 mg/dL (ref 0–200)
HDL: 47.2 mg/dL (ref >39.00)
LDL Cholesterol: 112 mg/dL — ABNORMAL HIGH (ref 0–99)
NonHDL: 136.25
Total CHOL/HDL Ratio: 4
Triglycerides: 119 mg/dL (ref 0.0–149.0)
VLDL: 23.8 mg/dL (ref 0.0–40.0)

## 2021-07-22 LAB — COMPREHENSIVE METABOLIC PANEL
ALT: 23 U/L (ref 0–53)
AST: 20 U/L (ref 0–37)
Albumin: 4.3 g/dL (ref 3.5–5.2)
Alkaline Phosphatase: 46 U/L (ref 39–117)
BUN: 13 mg/dL (ref 6–23)
CO2: 30 mEq/L (ref 19–32)
Calcium: 9.6 mg/dL (ref 8.4–10.5)
Chloride: 103 mEq/L (ref 96–112)
Creatinine, Ser: 0.97 mg/dL (ref 0.40–1.50)
GFR: 89.03 mL/min (ref >60.00)
Glucose, Bld: 101 mg/dL — ABNORMAL HIGH (ref 70–99)
Potassium: 5 mEq/L (ref 3.5–5.1)
Sodium: 139 mEq/L (ref 135–145)
Total Bilirubin: 0.7 mg/dL (ref 0.2–1.2)
Total Protein: 7.1 g/dL (ref 6.0–8.3)

## 2021-07-23 LAB — TSH: TSH: 0.86 u[IU]/mL (ref 0.35–5.50)

## 2021-07-23 LAB — PSA: PSA: 0.86 ng/mL (ref 0.10–4.00)

## 2021-07-29 ENCOUNTER — Encounter: Payer: Self-pay | Admitting: Family Medicine

## 2021-07-29 ENCOUNTER — Ambulatory Visit (INDEPENDENT_AMBULATORY_CARE_PROVIDER_SITE_OTHER): Payer: BC Managed Care – PPO | Admitting: Family Medicine

## 2021-07-29 VITALS — BP 130/68 | HR 65 | Temp 98.4°F | Ht 71.25 in | Wt 232.4 lb

## 2021-07-29 DIAGNOSIS — E66811 Obesity, class 1: Secondary | ICD-10-CM

## 2021-07-29 DIAGNOSIS — E669 Obesity, unspecified: Secondary | ICD-10-CM

## 2021-07-29 DIAGNOSIS — E039 Hypothyroidism, unspecified: Secondary | ICD-10-CM | POA: Diagnosis not present

## 2021-07-29 DIAGNOSIS — E785 Hyperlipidemia, unspecified: Secondary | ICD-10-CM

## 2021-07-29 DIAGNOSIS — Z0000 Encounter for general adult medical examination without abnormal findings: Secondary | ICD-10-CM | POA: Diagnosis not present

## 2021-07-29 DIAGNOSIS — I10 Essential (primary) hypertension: Secondary | ICD-10-CM | POA: Diagnosis not present

## 2021-07-29 DIAGNOSIS — F988 Other specified behavioral and emotional disorders with onset usually occurring in childhood and adolescence: Secondary | ICD-10-CM

## 2021-07-29 DIAGNOSIS — G894 Chronic pain syndrome: Secondary | ICD-10-CM

## 2021-07-29 MED ORDER — LISINOPRIL 5 MG PO TABS: 5.0000 mg | ORAL_TABLET | Freq: Every day | ORAL | 3 refills | Status: DC

## 2021-07-29 MED ORDER — LEVOTHYROXINE SODIUM 137 MCG PO TABS: 137.0000 ug | ORAL_TABLET | Freq: Every day | ORAL | 3 refills | Status: DC

## 2021-07-29 MED ORDER — ASPIRIN EC 81 MG PO TBEC: 81.0000 mg | DELAYED_RELEASE_TABLET | ORAL | Status: AC

## 2021-07-29 MED ORDER — ATORVASTATIN CALCIUM 40 MG PO TABS: 40.0000 mg | ORAL_TABLET | Freq: Every day | ORAL | 3 refills | Status: DC

## 2021-07-29 NOTE — Assessment & Plan Note (Addendum)
Chronic, stable period on Concerta, more recently switched to generic methylphenidate CR and still having difficulty finding med due to national shortage issues. Discussed possible trial Vyvanse next fill if any easier to fill - would need PA for this, he will send me a MyChart message in 08/2021 to start PA process for Vvyanse. ?

## 2021-07-29 NOTE — Assessment & Plan Note (Signed)
Sees pain clinic [facility]).  ?

## 2021-07-29 NOTE — Patient Instructions (Addendum)
Send me a reminder message late June to send in [place] for Vyvanse to replace Methylphenidate CR due to national shortage.  ?Good to see you today ?Ok to drop aspirin frequency. ?Return as needed or in 1 year for next physical.  ? ?Health Maintenance, Male ?Adopting a healthy lifestyle and getting preventive care are important in promoting health and wellness. Ask your health care provider about: ?The right schedule for you to have regular tests and exams. ?Things you can do on your own to prevent diseases and keep yourself healthy. ?What should I know about diet, weight, and exercise? ?Eat a healthy diet ? ?Eat a diet that includes plenty of vegetables, fruits, low-fat dairy products, and lean protein. ?Do not eat a lot of foods that are high in solid fats, added sugars, or sodium. ?Maintain a healthy weight ?Body mass index (BMI) is a measurement that can be used to identify possible weight problems. It estimates body fat based on height and weight. Your health care provider can help determine your BMI and help you achieve or maintain a healthy weight. ?Get regular exercise ?Get regular exercise. This is one of the most important things you can do for your health. Most adults should: ?Exercise for at least 150 minutes each week. The exercise should increase your heart rate and make you sweat (moderate-intensity exercise). ?Do strengthening exercises at least twice a week. This is in addition to the moderate-intensity exercise. ?Spend less time sitting. Even light physical activity can be beneficial. ?Watch cholesterol and blood lipids ?Have your blood tested for lipids and cholesterol at 54 years of age, then have this test every 5 years. ?You may need to have your cholesterol levels checked more often if: ?Your lipid or cholesterol levels are high. ?You are older than 54 years of age. ?You are at high risk for heart disease. ?What should I know about cancer screening? ?Many types of cancers can be detected early and  may often be prevented. Depending on your health history and family history, you may need to have cancer screening at various ages. This may include screening for: ?Colorectal cancer. ?Prostate cancer. ?Skin cancer. ?Lung cancer. ?What should I know about heart disease, diabetes, and high blood pressure? ?Blood pressure and heart disease ?High blood pressure causes heart disease and increases the risk of stroke. This is more likely to develop in people who have high blood pressure readings or are overweight. ?Talk with your health care provider about your target blood pressure readings. ?Have your blood pressure checked: ?Every 3-5 years if you are 39-38 years of age. ?Every year if you are 10 years old or older. ?If you are between the ages of 43 and 96 and are a current or former smoker, ask your health care provider if you should have a one-time screening for abdominal aortic aneurysm (AAA). ?Diabetes ?Have regular diabetes screenings. This checks your fasting blood sugar level. Have the screening done: ?Once every three years after age 63 if you are at a normal weight and have a low risk for diabetes. ?More often and at a younger age if you are overweight or have a high risk for diabetes. ?What should I know about preventing infection? ?Hepatitis B ?If you have a higher risk for hepatitis B, you should be screened for this virus. Talk with your health care provider to find out if you are at risk for hepatitis B infection. ?Hepatitis C ?Blood testing is recommended for: ?Everyone born from [birth year] through 1963. ?Anyone with known  risk factors for hepatitis C. ?Sexually transmitted infections (STIs) ?You should be screened each year for STIs, including gonorrhea and chlamydia, if: ?You are sexually active and are younger than 54 years of age. ?You are older than 54 years of age and your health care provider tells you that you are at risk for this type of infection. ?Your sexual activity has changed since you were  last screened, and you are at increased risk for chlamydia or gonorrhea. Ask your health care provider if you are at risk. ?Ask your health care provider about whether you are at high risk for HIV. Your health care provider may recommend a prescription medicine to help prevent HIV infection. If you choose to take medicine to prevent HIV, you should first get tested for HIV. You should then be tested every 3 months for as long as you are taking the medicine. ?Follow these instructions at home: ?Alcohol use ?Do not drink alcohol if your health care provider tells you not to drink. ?If you drink alcohol: ?Limit how much you have to 0-2 drinks a day. ?Know how much alcohol is in your drink. In the [place] one drink equals one 12 oz bottle of beer (355 mL), one 5 oz glass of wine (148 mL), or one 1? oz glass of hard liquor (44 mL). ?Lifestyle ?Do not use any products that contain nicotine or tobacco. These products include cigarettes, chewing tobacco, and vaping devices, such as e-cigarettes. If you need help quitting, ask your health care provider. ?Do not use street drugs. ?Do not share needles. ?Ask your health care provider for help if you need support or information about quitting drugs. ?General instructions ?Schedule regular health, dental, and eye exams. ?Stay current with your vaccines. ?Tell your health care provider if: ?You often feel depressed. ?You have ever been abused or do not feel safe at home. ?Summary ?Adopting a healthy lifestyle and getting preventive care are important in promoting health and wellness. ?Follow your health care provider's instructions about healthy diet, exercising, and getting tested or screened for diseases. ?Follow your health care provider's instructions on monitoring your cholesterol and blood pressure. ?This information is not intended to replace advice given to you by your health care provider. Make sure you discuss any questions you have with your health care  provider. ?Document Revised: 07/23/2020 Document Reviewed: 07/23/2020 ?Elsevier Patient Education ? [place]. ? ?

## 2021-07-29 NOTE — Assessment & Plan Note (Signed)
Stable period on levothyroxine 164mg daily.  ?

## 2021-07-29 NOTE — Progress Notes (Signed)
? ? Patient ID: Chase Burke, male    DOB: 02-03-1966, 56 y.o.   MRN: [id number] ? ?This visit was conducted in person. ? ?BP 130/68   Pulse 65   Temp 98.4 ?F (36.9 ?C) (Temporal)   Ht 5' 11.25" (1.81 m)   Wt 232 lb 6 oz (105.4 kg)   SpO2 99%   BMI 32.18 kg/m?   ? ?CC: CPE ?Subjective:  ? ?HPI: ?Joel Cowin is a 56 y.o. male presenting on 07/29/2021 for Annual Exam ? ? ?Pain management through Dr Hardin Negus.  ? 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 Stable period, effective. Tolerating well, without HA, insomnia, chest pain, anorexia. Over this past year we've had significant struggle in finding medication due to national shortage of stimulants - predominantly Concerta (both generic and brand).  ? ?Stable period on levothyroxine 164mg daily.  ? ?Preventative: ?Colonoscopy 01/2019 - 5 TAs, rpt 3 yrs (Ardis Hughs  ?Prostate cancer screening - discussed, would like screening given fmhx (father dx age 7723s/p radiation). PSA yearly, DRE QOY (done 2022).  ?Lung cancer screen - not eligible ?Flu shot - declines - h/o bad reaction with arm swelling to previous flu shot  ?COVID vaccine Moderna 05/2019, 06/2019, booster Pfizer 02/2020  ?Hep A series today  ?Tdap 2014  ?Zostavax 2014  ?Shingrix - 05/2020, 08/2020 ?Seat belt use discussed  ?Sunscreens use discussed, no changing moles on skin  ?Ex-smoker (2009)  ?Alcohol - 2-3 beers daily  ?Dentist yearly  ?Eye exam - yearly  ? ?Lives with wife and son, no pets  ?Edu: PhD ([employer]) - working on masters degree in math  ?Occ: Economy Professor at [employer] ?Activity: enjoys disc golf on weekends, 3d/wk  ?Diet: good water, fruits/vegetable daily  ?   ? ?Relevant past medical, surgical, family and social history reviewed and updated as indicated. Interim medical history since our last visit reviewed. ?Allergies and medications reviewed and updated. ?Outpatient Medications Prior to Visit  ?Medication Sig Dispense Refill  ? buprenorphine (BUTRANS) 15 MCG/HR 1 patch once a week.    ?  cetirizine (ZYRTEC) 10 MG tablet Take 10 mg by mouth daily.    ? cyclobenzaprine (FLEXERIL) 10 MG tablet 10 mg. Takes 0.5-1 tablet 3 times daily as needed.  Takes 0.5 tablet at bedtime    ? diphenhydrAMINE (BENADRYL) 25 MG tablet Take 25-50 mg by mouth at bedtime as needed for sleep.     ? fluticasone (FLONASE) 50 MCG/ACT nasal spray Place 2 sprays into both nostrils daily. (Patient taking differently: Place 2 sprays into both nostrils daily. As needed) 16 g 6  ? Glucos-MSM-C-Mn-Ginger-Willow (GLUCOSAMINE MSM COMPLEX PO) Take 1 tablet by mouth daily.    ? Melatonin 3 MG TABS Take 3 mg by mouth at bedtime.    ? methylphenidate 36 MG PO CR tablet Take 1 tablet (36 mg total) by mouth daily. 90 tablet 0  ? Multiple Vitamin (MULTIVITAMIN) tablet Take 1 tablet by mouth daily.    ? naproxen (NAPROSYN) 250 MG tablet Take 250 mg by mouth 2 (two) times daily with a meal. Takes 220 mg    ? aspirin EC 81 MG tablet Take 81 mg by mouth daily.    ? atorvastatin (LIPITOR) 40 MG tablet Take 1 tablet (40 mg total) by mouth daily. 90 tablet 0  ? levothyroxine (SYNTHROID) 137 MCG tablet Take 1 tablet (137 mcg total) by mouth daily before breakfast. 90 tablet 0  ? lisinopril (ZESTRIL) 5 MG tablet Take 1 tablet (5  mg total) by mouth daily. 90 tablet 0  ? methylphenidate 36 MG PO CR tablet Take 1 tablet (36 mg total) by mouth daily. 14 tablet 0  ? ?No facility-administered medications prior to visit.  ?  ? ?Per HPI unless specifically indicated in ROS section below ?Review of Systems  ?Constitutional:  Negative for activity change, appetite change, chills, fatigue, fever and unexpected weight change.  ?HENT:  Negative for hearing loss.   ?Eyes:  Negative for visual disturbance.  ?Respiratory:  Negative for cough, chest tightness, shortness of breath and wheezing.   ?Cardiovascular:  Negative for chest pain, palpitations and leg swelling.  ?Gastrointestinal:  Negative for abdominal distention, abdominal pain, blood in stool, constipation,  diarrhea, nausea and vomiting.  ?Genitourinary:  Negative for difficulty urinating and hematuria.  ?Musculoskeletal:  Negative for arthralgias, myalgias and neck pain.  ?Skin:  Negative for rash.  ?Neurological:  Negative for dizziness, seizures, syncope and headaches.  ?Hematological:  Negative for adenopathy. Does not bruise/bleed easily.  ?Psychiatric/Behavioral:  Negative for dysphoric mood. The patient is not nervous/anxious.   ? ?Objective:  ?BP 130/68   Pulse 65   Temp 98.4 ?F (36.9 ?C) (Temporal)   Ht 5' 11.25" (1.81 m)   Wt 232 lb 6 oz (105.4 kg)   SpO2 99%   BMI 32.18 kg/m?   ?Wt Readings from Last 3 Encounters:  ?07/29/21 232 lb 6 oz (105.4 kg)  ?10/30/20 222 lb (100.7 kg)  ?05/18/20 222 lb 6 oz (100.9 kg)  ?  ?  ?Physical Exam ?Vitals and nursing note reviewed.  ?Constitutional:   ?   General: He is not in acute distress. ?   Appearance: Normal appearance. He is well-developed. He is not ill-appearing.  ?HENT:  ?   Head: Normocephalic and atraumatic.  ?   Right Ear: Hearing, tympanic membrane, ear canal and external ear normal.  ?   Left Ear: Hearing, tympanic membrane, ear canal and external ear normal.  ?Eyes:  ?   General: No scleral icterus. ?   Extraocular Movements: Extraocular movements intact.  ?   Conjunctiva/sclera: Conjunctivae normal.  ?   Pupils: Pupils are equal, round, and reactive to light.  ?Neck:  ?   Thyroid: No thyroid mass or thyromegaly.  ?Cardiovascular:  ?   Rate and Rhythm: Normal rate and regular rhythm.  ?   Pulses: Normal pulses.     ?     Radial pulses are 2+ on the right side and 2+ on the left side.  ?   Heart sounds: Normal heart sounds. No murmur heard. ?Pulmonary:  ?   Effort: Pulmonary effort is normal. No respiratory distress.  ?   Breath sounds: Normal breath sounds. No wheezing, rhonchi or rales.  ?Abdominal:  ?   General: Bowel sounds are normal. There is no distension.  ?   Palpations: Abdomen is soft. There is no mass.  ?   Tenderness: There is no abdominal  tenderness. There is no guarding or rebound.  ?   Hernia: No hernia is present.  ?Musculoskeletal:     ?   General: Normal range of motion.  ?   Cervical back: Normal range of motion and neck supple.  ?   Right lower leg: No edema.  ?   Left lower leg: No edema.  ?Lymphadenopathy:  ?   Cervical: No cervical adenopathy.  ?Skin: ?   General: Skin is warm and dry.  ?   Findings: No rash.  ?Neurological:  ?  General: No focal deficit present.  ?   Mental Status: He is alert and oriented to person, place, and time.  ?Psychiatric:     ?   Mood and Affect: Mood normal.     ?   Behavior: Behavior normal.     ?   Thought Content: Thought content normal.     ?   Judgment: Judgment normal.  ? ?   ?Results for orders placed or performed in visit on 07/22/21  ?PSA  ?Result Value Ref Range  ? PSA 0.86 0.10 - 4.00 ng/mL  ?TSH  ?Result Value Ref Range  ? TSH 0.86 0.35 - 5.50 uIU/mL  ?Comprehensive metabolic panel  ?Result Value Ref Range  ? Sodium 139 135 - 145 mEq/L  ? Potassium 5.0 3.5 - 5.1 mEq/L  ? Chloride 103 96 - 112 mEq/L  ? CO2 30 19 - 32 mEq/L  ? Glucose, Bld 101 (H) 70 - 99 mg/dL  ? BUN 13 6 - 23 mg/dL  ? Creatinine, Ser 0.97 0.40 - 1.50 mg/dL  ? Total Bilirubin 0.7 0.2 - 1.2 mg/dL  ? Alkaline Phosphatase 46 39 - 117 U/L  ? AST 20 0 - 37 U/L  ? ALT 23 0 - 53 U/L  ? Total Protein 7.1 6.0 - 8.3 g/dL  ? Albumin 4.3 3.5 - 5.2 g/dL  ? GFR 89.03 >60.00 mL/min  ? Calcium 9.6 8.4 - 10.5 mg/dL  ?Lipid panel  ?Result Value Ref Range  ? Cholesterol 183 0 - 200 mg/dL  ? Triglycerides 119.0 0.0 - 149.0 mg/dL  ? HDL 47.20 >39.00 mg/dL  ? VLDL 23.8 0.0 - 40.0 mg/dL  ? LDL Cholesterol 112 (H) 0 - 99 mg/dL  ? Total CHOL/HDL Ratio 4   ? NonHDL 136.25   ? ? ?Assessment & Plan:  ? ?Problem List Items Addressed This Visit   ? ? Health maintenance examination - Primary (Chronic)  ?  Preventative protocols reviewed and updated unless pt declined. ?Discussed healthy diet and lifestyle.  ? ?  ?  ? Hyperlipidemia  ?  Chronic, stable on  atorvastatin '40mg'$  daily. Continue. fmhx stroke (father age 79). Discussed likely ok to discontinue aspirin for primary cardiovascular prevently.  ?The 10-year ASCVD risk score (Arnett DK, et al., 2019) is: 5.3% ?  Values used t

## 2021-07-29 NOTE — Assessment & Plan Note (Signed)
Preventative protocols reviewed and updated unless pt declined. Discussed healthy diet and lifestyle.  

## 2021-07-29 NOTE — Assessment & Plan Note (Signed)
Chronic, stable on low dose lisinopril.  ?

## 2021-07-29 NOTE — Assessment & Plan Note (Signed)
Encouraged ongoing healthy diet and lifestyle including regular exercise routine to affect sustainable weight loss.  ?

## 2021-07-29 NOTE — Assessment & Plan Note (Addendum)
Chronic, stable on atorvastatin '40mg'$  daily. Continue. fmhx stroke (father age 54). Discussed likely ok to discontinue aspirin for primary cardiovascular prevently.  ?The 10-year ASCVD risk score (Arnett DK, et al., 2019) is: 5.3% ?  Values used to calculate the score: ?    Age: 13 years ?    Sex: Male ?    Is Non-Hispanic African American: No ?    Diabetic: No ?    Tobacco smoker: No ?    Systolic Blood Pressure: 254 mmHg ?    Is BP treated: Yes ?    HDL Cholesterol: 47.2 mg/dL ?    Total Cholesterol: 183 mg/dL  ?

## 2021-09-12 ENCOUNTER — Encounter: Payer: Self-pay | Admitting: Family Medicine

## 2021-09-15 MED ORDER — LISDEXAMFETAMINE DIMESYLATE 30 MG PO CAPS: 30.0000 mg | ORAL_CAPSULE | Freq: Every day | ORAL | 0 refills | Status: DC

## 2021-09-16 ENCOUNTER — Telehealth: Payer: Self-pay

## 2021-09-16 NOTE — Telephone Encounter (Signed)
Prior auth started for Vyvanse '30MG'$  capsules. Rocky Theodora BlowGarlon Hatchet - PA Case ID: [id number] - Rx #: [id number] Waiting for determination.

## 2021-09-18 NOTE — Telephone Encounter (Signed)
Noted  

## 2021-09-18 NOTE — Telephone Encounter (Signed)
Prior auth for Vyvanse '30MG'$  capsules has been approved. Dhilan Theodora BlowGarlon Hatchet - PA Case ID: [id number] - Rx #: [id number]  [facility]  received a request from your provider for coverage of Vyvanse '30mg'$  Capsules (lisdexamfetamine).  As long as you remain covered by the [facility] and there are no changes to your plan benefits, this request is approved for the following time period: 09/16/2021 - 09/16/2024  Approval letter sent to scanning.

## 2021-10-16 ENCOUNTER — Other Ambulatory Visit: Payer: Self-pay | Admitting: Family Medicine

## 2021-10-17 MED ORDER — LISDEXAMFETAMINE DIMESYLATE 40 MG PO CAPS: 40.0000 mg | ORAL_CAPSULE | Freq: Every day | ORAL | 0 refills | Status: DC

## 2021-10-17 NOTE — Telephone Encounter (Signed)
ERx '40mg'$  dose of vyvanse as titration.  Replied via [employer]

## 2021-10-17 NOTE — Telephone Encounter (Signed)
Patient comment: I have been rotating every few days in between concerta and Vyvanse to get a feel for it.  It seems to be working well.  It seems that the standard protocaol is to bump up to '50mg'$ ; but I think that '40mg'$  might be good enough, especially trying to balance with blood pressure concerns. However, I defer to your wisdom.

## 2021-10-22 ENCOUNTER — Telehealth: Payer: Self-pay

## 2021-10-22 NOTE — Telephone Encounter (Signed)
Prior auth started for Vyvanse '40MG'$  capsules. Judye Bos Key: [id number] - PA Case ID: [id number] - Rx #: [id number] Waiting for determination.

## 2021-10-23 NOTE — Telephone Encounter (Signed)
Prior auth for Vyvanse '40MG'$  capsules has been approved. Judye Bos Key: [id number] - PA Case ID: [id number] - Rx #: [id number]  [facility]  received a request from your provider for coverage of Vyvanse '40mg'$  Capsules (lisdexamfetamine). As long as you remain covered by the [facility] and there are no changes to your plan benefits, this request is approved for the following time period: 10/22/2021 - 10/22/2024  Approval letter sent to scanning.  Notified patient via mychart.  Approval letter faxed to [phone] to [place] per their request.

## 2021-11-21 ENCOUNTER — Other Ambulatory Visit: Payer: Self-pay | Admitting: Family Medicine

## 2021-11-21 NOTE — Telephone Encounter (Signed)
Name of Medication: Vyvanse Name of Pharmacy: [facility] or Written Date and Quantity: 10/22/21, #30 Last Office Visit and Type: 07/29/21, CPE Next Office Visit and Type: none Last Controlled Substance Agreement Date: 02/09/17 Last UDS: 04/07/18

## 2021-11-21 NOTE — Telephone Encounter (Signed)
ERx 

## 2021-12-21 ENCOUNTER — Other Ambulatory Visit: Payer: Self-pay | Admitting: Family Medicine

## 2021-12-23 ENCOUNTER — Encounter: Payer: Self-pay | Admitting: Family Medicine

## 2021-12-23 NOTE — Telephone Encounter (Signed)
Name of Medication: Vyvanse Name of Pharmacy: [facility] or Written Date and Quantity: 11/22/21, #30 Last Office Visit and Type: 07/29/21, CPE Next Office Visit and Type: none Last Controlled Substance Agreement Date: 02/09/17 Last UDS: 04/07/18

## 2021-12-23 NOTE — Telephone Encounter (Signed)
ERx 

## 2021-12-24 MED ORDER — LISDEXAMFETAMINE DIMESYLATE 40 MG PO CAPS: 40.0000 mg | ORAL_CAPSULE | Freq: Every day | ORAL | 0 refills | Status: DC

## 2022-01-13 ENCOUNTER — Encounter: Payer: Self-pay | Admitting: Family Medicine

## 2022-01-16 MED ORDER — LISDEXAMFETAMINE DIMESYLATE 40 MG PO CAPS: 40.0000 mg | ORAL_CAPSULE | Freq: Every day | ORAL | 0 refills | Status: DC

## 2022-01-17 ENCOUNTER — Telehealth: Payer: Self-pay

## 2022-01-17 MED ORDER — LISDEXAMFETAMINE DIMESYLATE 40 MG PO CAPS: 40.0000 mg | ORAL_CAPSULE | Freq: Every day | ORAL | 0 refills | Status: DC

## 2022-01-17 NOTE — Telephone Encounter (Addendum)
Spoke with [facility] to make them aware of PA approval for Vyvanse.  However, states they only have brand name so pt will have to get med from a different pharmacy.  Need pt to find out which pharmacy [facility] [address] the generic Vyvanse, then let us know to send rx.  Lvm asking pt to call back.  Need to relay message above and let us know which pharmacy rx needs to be sent to. Also, sending pt a MyChart message.

## 2022-01-17 NOTE — Telephone Encounter (Signed)
Script needs to go to [facility]

## 2022-01-17 NOTE — Addendum Note (Signed)
Addended by: Ria Bush on: 01/17/2022 04:34 PM   Modules accepted: Orders

## 2022-01-17 NOTE — Telephone Encounter (Signed)
Prior auth started for Vyvanse '40MG'$  capsules. Judye Bos Key: [id number] - Rx #: [id number] Per Cover My Meds:  Your PA has been resolved, no additional PA is required. For further inquiries please contact the number on the back of the member prescription card.  I called [facility] at [phone].  This PA has been approved previously for dates of 10/22/21-10/22/24.  Notified patient via mychart.

## 2022-01-17 NOTE — Telephone Encounter (Signed)
ERx 

## 2022-02-19 ENCOUNTER — Other Ambulatory Visit: Payer: Self-pay | Admitting: Family Medicine

## 2022-02-19 ENCOUNTER — Other Ambulatory Visit: Payer: Self-pay

## 2022-02-19 NOTE — Telephone Encounter (Signed)
Message from pharmacy: Resend 2 more rx's for #30.  Insurance covers #30. Use #30, 01/22/22.

## 2022-02-20 MED ORDER — LISDEXAMFETAMINE DIMESYLATE 40 MG PO CAPS: 40.0000 mg | ORAL_CAPSULE | ORAL | 0 refills | Status: DC

## 2022-02-20 MED ORDER — LISDEXAMFETAMINE DIMESYLATE 40 MG PO CAPS: 40.0000 mg | ORAL_CAPSULE | Freq: Every day | ORAL | 0 refills | Status: DC

## 2022-02-20 NOTE — Telephone Encounter (Signed)
ERx 

## 2022-02-21 NOTE — Telephone Encounter (Signed)
Duplicate request.  Rxs already sent.

## 2022-02-25 NOTE — Telephone Encounter (Signed)
Denied -already addressed

## 2022-03-13 ENCOUNTER — Encounter: Payer: Self-pay | Admitting: Family Medicine

## 2022-03-13 DIAGNOSIS — I10 Essential (primary) hypertension: Secondary | ICD-10-CM

## 2022-03-13 NOTE — Telephone Encounter (Signed)
Lisinopril 5 mg  Last rx: 07/29/21, #90 Last OV: 07/29/21, CPE Next OV: none

## 2022-03-14 MED ORDER — LISINOPRIL 10 MG PO TABS: 10.0000 mg | ORAL_TABLET | Freq: Every day | ORAL | 2 refills | Status: DC

## 2022-03-14 NOTE — Addendum Note (Signed)
Addended by: Ria Bush on: 03/14/2022 05:18 PM   Modules accepted: Orders

## 2022-04-20 ENCOUNTER — Other Ambulatory Visit: Payer: Self-pay | Admitting: Family Medicine

## 2022-04-21 NOTE — Telephone Encounter (Signed)
Name of Medication: Vyvanse Name of Pharmacy: [facility] or Written Date and Quantity: 03/23/22, #30 Last Office Visit and Type: 07/29/21, CPE Next Office Visit and Type: none Last Controlled Substance Agreement Date: 02/09/17 Last UDS: 04/07/18

## 2022-04-22 MED ORDER — LISDEXAMFETAMINE DIMESYLATE 40 MG PO CAPS: 40.0000 mg | ORAL_CAPSULE | Freq: Every day | ORAL | 0 refills | Status: DC

## 2022-04-22 NOTE — Telephone Encounter (Signed)
ERx 

## 2022-05-19 ENCOUNTER — Other Ambulatory Visit: Payer: Self-pay | Admitting: Family Medicine

## 2022-05-19 NOTE — Telephone Encounter (Signed)
Name of Medication: Vyvanse Name of Pharmacy: [facility] or Written Date and Quantity: 04/22/22, #30 Last Office Visit and Type: 07/29/21, CPE Next Office Visit and Type: 08/04/22, CPE Last Controlled Substance Agreement Date: 02/09/17 Last UDS: 04/07/18

## 2022-05-21 MED ORDER — LISDEXAMFETAMINE DIMESYLATE 40 MG PO CAPS: 40.0000 mg | ORAL_CAPSULE | Freq: Every day | ORAL | 0 refills | Status: DC

## 2022-05-21 NOTE — Telephone Encounter (Signed)
ERx 

## 2022-05-23 ENCOUNTER — Encounter: Payer: Self-pay | Admitting: Family Medicine

## 2022-05-26 ENCOUNTER — Telehealth: Payer: Self-pay

## 2022-05-26 MED ORDER — LISDEXAMFETAMINE DIMESYLATE 50 MG PO CHEW: 1.0000 | CHEWABLE_TABLET | Freq: Every day | ORAL | 0 refills | Status: DC

## 2022-05-26 NOTE — Telephone Encounter (Addendum)
Plz submit PA for lisdexamfetamine dimesylate (Vyvanse) 50 mg chew.

## 2022-05-26 NOTE — Telephone Encounter (Addendum)
PA request for lisdexamfetamine dimesylate (Vyvanse) 50 mg chew sent to Rx Prior Auth Team. (See 05/26/22 phn note.)

## 2022-05-30 NOTE — Telephone Encounter (Addendum)
Fyi to Dr. G 

## 2022-06-02 ENCOUNTER — Other Ambulatory Visit (HOSPITAL_COMMUNITY): Payer: Self-pay

## 2022-06-02 NOTE — Telephone Encounter (Signed)
Pharmacy Patient Advocate Encounter   Received notification that prior authorization for Lisdexamfetamine 50mg   is required/requested.  Per Test Claim: Prior authorization required   PA submitted on 06/02/22 to ([employer]) [employer] via CoverMyMeds Key #  [id number] Status is pending

## 2022-06-02 NOTE — Telephone Encounter (Signed)
Pt called to know status of medication if there was any up dates [phone]

## 2022-06-03 ENCOUNTER — Other Ambulatory Visit (HOSPITAL_COMMUNITY): Payer: Self-pay

## 2022-06-03 MED ORDER — LISDEXAMFETAMINE DIMESYLATE 40 MG PO CAPS
40.0000 mg | ORAL_CAPSULE | Freq: Every day | ORAL | 0 refills | Status: DC
  Filled 2022-06-03: qty 30, 30d supply, fill #0

## 2022-06-03 NOTE — Telephone Encounter (Signed)
Patient Advocate Encounter  Prior Authorization for Lisdexamfetamine Dimesylate 50MG  chewable tablets has been approved.    PA# [id number] Effective dates: 06/02/22 through 05/31/25

## 2022-06-03 NOTE — Addendum Note (Signed)
Addended by: Ria Bush on: 06/03/2022 07:20 AM   Modules accepted: Orders

## 2022-06-06 ENCOUNTER — Other Ambulatory Visit (HOSPITAL_COMMUNITY): Payer: Self-pay

## 2022-06-06 MED ORDER — BUPRENORPHINE 15 MCG/HR TD PTWK
1.0000 | MEDICATED_PATCH | TRANSDERMAL | 3 refills | Status: DC
  Filled 2022-06-17 – 2022-06-23 (×3): qty 4, 28d supply, fill #0
  Filled 2022-07-17: qty 4, 28d supply, fill #1
  Filled 2022-08-15: qty 4, 28d supply, fill #2

## 2022-06-06 MED ORDER — CYCLOBENZAPRINE HCL 5 MG PO TABS
5.0000 mg | ORAL_TABLET | Freq: Three times a day (TID) | ORAL | 2 refills | Status: DC
  Filled 2022-06-06: qty 90, 30d supply, fill #0
  Filled 2022-07-17: qty 90, 30d supply, fill #1
  Filled 2022-08-15: qty 90, 30d supply, fill #2

## 2022-06-17 ENCOUNTER — Other Ambulatory Visit (HOSPITAL_COMMUNITY): Payer: Self-pay

## 2022-06-18 ENCOUNTER — Other Ambulatory Visit (HOSPITAL_COMMUNITY): Payer: Self-pay

## 2022-06-19 ENCOUNTER — Other Ambulatory Visit (HOSPITAL_COMMUNITY): Payer: Self-pay

## 2022-06-23 ENCOUNTER — Other Ambulatory Visit (HOSPITAL_COMMUNITY): Payer: Self-pay

## 2022-07-18 ENCOUNTER — Other Ambulatory Visit: Payer: Self-pay

## 2022-07-18 ENCOUNTER — Other Ambulatory Visit (HOSPITAL_COMMUNITY): Payer: Self-pay

## 2022-07-21 ENCOUNTER — Other Ambulatory Visit (HOSPITAL_COMMUNITY): Payer: Self-pay

## 2022-07-23 ENCOUNTER — Encounter: Payer: Self-pay | Admitting: Family Medicine

## 2022-07-23 DIAGNOSIS — F988 Other specified behavioral and emotional disorders with onset usually occurring in childhood and adolescence: Secondary | ICD-10-CM

## 2022-07-23 NOTE — Telephone Encounter (Signed)
Pt requesting rx for chewable tabs previously prescribed.

## 2022-07-24 ENCOUNTER — Other Ambulatory Visit: Payer: Self-pay | Admitting: Family Medicine

## 2022-07-24 DIAGNOSIS — E785 Hyperlipidemia, unspecified: Secondary | ICD-10-CM

## 2022-07-24 DIAGNOSIS — E039 Hypothyroidism, unspecified: Secondary | ICD-10-CM

## 2022-07-25 ENCOUNTER — Other Ambulatory Visit (HOSPITAL_COMMUNITY): Payer: Self-pay

## 2022-07-25 MED ORDER — LISDEXAMFETAMINE DIMESYLATE 50 MG PO CHEW
1.0000 | CHEWABLE_TABLET | Freq: Every day | ORAL | 0 refills | Status: DC
  Filled 2022-07-25: qty 30, 30d supply, fill #0

## 2022-07-27 ENCOUNTER — Other Ambulatory Visit: Payer: Self-pay | Admitting: Family Medicine

## 2022-07-27 DIAGNOSIS — E785 Hyperlipidemia, unspecified: Secondary | ICD-10-CM

## 2022-07-27 DIAGNOSIS — E039 Hypothyroidism, unspecified: Secondary | ICD-10-CM

## 2022-07-27 DIAGNOSIS — Z125 Encounter for screening for malignant neoplasm of prostate: Secondary | ICD-10-CM

## 2022-07-28 ENCOUNTER — Other Ambulatory Visit (INDEPENDENT_AMBULATORY_CARE_PROVIDER_SITE_OTHER): Payer: BC Managed Care – PPO

## 2022-07-28 DIAGNOSIS — E039 Hypothyroidism, unspecified: Secondary | ICD-10-CM | POA: Diagnosis not present

## 2022-07-28 DIAGNOSIS — Z125 Encounter for screening for malignant neoplasm of prostate: Secondary | ICD-10-CM | POA: Diagnosis not present

## 2022-07-28 DIAGNOSIS — E785 Hyperlipidemia, unspecified: Secondary | ICD-10-CM | POA: Diagnosis not present

## 2022-07-28 LAB — LIPID PANEL
Cholesterol: 169 mg/dL (ref 0–200)
HDL: 44.1 mg/dL (ref >39.00)
LDL Cholesterol: 96 mg/dL (ref 0–99)
NonHDL: 124.82
Total CHOL/HDL Ratio: 4
Triglycerides: 142 mg/dL (ref 0.0–149.0)
VLDL: 28.4 mg/dL (ref 0.0–40.0)

## 2022-07-28 LAB — COMPREHENSIVE METABOLIC PANEL
ALT: 18 U/L (ref 0–53)
AST: 20 U/L (ref 0–37)
Albumin: 4.1 g/dL (ref 3.5–5.2)
Alkaline Phosphatase: 50 U/L (ref 39–117)
BUN: 20 mg/dL (ref 6–23)
CO2: 30 mEq/L (ref 19–32)
Calcium: 9.4 mg/dL (ref 8.4–10.5)
Chloride: 101 mEq/L (ref 96–112)
Creatinine, Ser: 0.92 mg/dL (ref 0.40–1.50)
GFR: 94.2 mL/min (ref >60.00)
Glucose, Bld: 90 mg/dL (ref 70–99)
Potassium: 4.2 mEq/L (ref 3.5–5.1)
Sodium: 139 mEq/L (ref 135–145)
Total Bilirubin: 0.5 mg/dL (ref 0.2–1.2)
Total Protein: 6.7 g/dL (ref 6.0–8.3)

## 2022-07-28 LAB — PSA: PSA: 0.81 ng/mL (ref 0.10–4.00)

## 2022-07-28 LAB — TSH: TSH: 3.14 u[IU]/mL (ref 0.35–5.50)

## 2022-07-29 ENCOUNTER — Telehealth: Payer: Self-pay

## 2022-07-29 NOTE — Telephone Encounter (Signed)
Ok to do. Plz notify pt of this change and that we should consider rechecking thyroid levels 4-6 wks after starting new formulation

## 2022-07-29 NOTE — Telephone Encounter (Signed)
Received faxed message from [facility] stating manufacturer change for pt's levothyroxine 137 mcg tab. Requesting permission to change to new manufacturer. Plz advise.

## 2022-07-30 NOTE — Telephone Encounter (Signed)
Spoke with [facility] notifying them Dr. Reece Agar is giving ok to change to new manufacturer.   Lvm asking pt to call back. Need to notify pt of manufacturer change for levothyroxine and relay Dr. Timoteo Expose message.

## 2022-07-30 NOTE — Telephone Encounter (Signed)
Patient called back in and relayed message below to him. He stated that he understands and he stated he has an appointment with Dr. Reece Agar coming up and will discuss about thyroid appt then. Thank you!

## 2022-07-30 NOTE — Telephone Encounter (Signed)
Noted  

## 2022-08-04 ENCOUNTER — Ambulatory Visit (INDEPENDENT_AMBULATORY_CARE_PROVIDER_SITE_OTHER): Payer: BC Managed Care – PPO | Admitting: Family Medicine

## 2022-08-04 ENCOUNTER — Encounter: Payer: Self-pay | Admitting: Family Medicine

## 2022-08-04 VITALS — BP 134/68 | HR 93 | Temp 97.5°F | Ht 71.5 in | Wt 221.1 lb

## 2022-08-04 DIAGNOSIS — M961 Postlaminectomy syndrome, not elsewhere classified: Secondary | ICD-10-CM

## 2022-08-04 DIAGNOSIS — I10 Essential (primary) hypertension: Secondary | ICD-10-CM

## 2022-08-04 DIAGNOSIS — E039 Hypothyroidism, unspecified: Secondary | ICD-10-CM

## 2022-08-04 DIAGNOSIS — F988 Other specified behavioral and emotional disorders with onset usually occurring in childhood and adolescence: Secondary | ICD-10-CM

## 2022-08-04 DIAGNOSIS — Z1211 Encounter for screening for malignant neoplasm of colon: Secondary | ICD-10-CM

## 2022-08-04 DIAGNOSIS — Z0000 Encounter for general adult medical examination without abnormal findings: Secondary | ICD-10-CM

## 2022-08-04 DIAGNOSIS — E785 Hyperlipidemia, unspecified: Secondary | ICD-10-CM

## 2022-08-04 DIAGNOSIS — E669 Obesity, unspecified: Secondary | ICD-10-CM

## 2022-08-04 DIAGNOSIS — S86002A Unspecified injury of left Achilles tendon, initial encounter: Secondary | ICD-10-CM

## 2022-08-04 DIAGNOSIS — G894 Chronic pain syndrome: Secondary | ICD-10-CM

## 2022-08-04 MED ORDER — LEVOTHYROXINE SODIUM 137 MCG PO TABS: 137.0000 ug | ORAL_TABLET | Freq: Every day | ORAL | 4 refills | Status: DC

## 2022-08-04 MED ORDER — ATORVASTATIN CALCIUM 40 MG PO TABS: 40.0000 mg | ORAL_TABLET | Freq: Every day | ORAL | 4 refills | Status: DC

## 2022-08-04 MED ORDER — LISINOPRIL 10 MG PO TABS: 10.0000 mg | ORAL_TABLET | Freq: Every day | ORAL | 4 refills | Status: DC

## 2022-08-04 NOTE — Progress Notes (Signed)
Ph: [phone] Fax: [phone]   Patient ID: Chase Burke, male    DOB: 1965-12-30, 57 y.o.   MRN: [id number]  This visit was conducted in person.  BP 134/68   Pulse 93   Temp (!) 97.5 F (36.4 C) (Temporal)   Ht 5' 11.5" (1.816 m)   Wt 221 lb 2 oz (100.3 kg)   SpO2 97%   BMI 30.41 kg/m    CC: CPE  Subjective:   HPI: [name] is a 57 y.o. male presenting on 08/04/2022 for Annual Exam   Listening to Out Live audio book. Requests ApoB lab test next time.   Pain management through Dr Vear Clock [facility]).   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 Stable period, effective. Tolerating well, without HA, insomnia, chest pain, anorexia. Over this past year we've had significant struggle in finding medication due to national shortage of stimulants - predominantly Concerta (both generic and brand). More recently has tried Vyvanse, currently on chewable lisdexamfetamine tablets 50mg  daily.   Stable period on levothyroxine daily. Recent formulary manufacturer change last week.   6 months ago injury to left achilles tendon at work, while adjusting his adjustable chair, it landed on left achilles tendon. Getting better, no weakness.   Preventative: Colonoscopy 01/2019 - 5 TAs, rpt 3 yrs Christella Hartigan)  Prostate cancer screening - fmhx (father dx age 70 s/p radiation). PSA yearly, DRE QOY (done 2022).  Lung cancer screen - not eligible Flu shot - declines - h/o bad reaction with arm swelling to previous flu shot  COVID vaccine Moderna 05/2019, 06/2019, booster Pfizer 02/2020  Hep A series completed Tdap 2014 - consider next year  Zostavax 2014  Shingrix - 05/2020, 08/2020 Seat belt use discussed  Sunscreens use discussed, no changing moles on skin  Ex-smoker (2009)  Alcohol - 2-3 beers daily  Dentist q6 mo Eye exam - yearly  Bowel - no constipation   Lives with wife and son, no pets  Edu: PhD ([employer] and Stats) - working on Event organiser in math  Occ: Economy Professor at [employer]   Activity: enjoys disc golf on weekends, 3d/wk  Diet: good water, fruits/vegetable daily      Relevant past medical, surgical, family and social history reviewed and updated as indicated. Interim medical history since our last visit reviewed. Allergies and medications reviewed and updated. Outpatient Medications Prior to Visit  Medication Sig Dispense Refill   aspirin EC 81 MG tablet Take 1 tablet (81 mg total) by mouth every other day.     buprenorphine (BUTRANS) 15 MCG/HR Place 1 patch onto the skin once a week. 4 patch 3   cetirizine (ZYRTEC) 10 MG tablet Take 10 mg by mouth daily.     cyclobenzaprine (FLEXERIL) 5 MG tablet Take 1 tablet (5 mg total) by mouth 3 (three) times daily as needed. 90 tablet 2   diphenhydrAMINE (BENADRYL) 25 MG tablet Take 25-50 mg by mouth at bedtime as needed for sleep.      fluticasone (FLONASE) 50 MCG/ACT nasal spray Place 2 sprays into both nostrils daily. (Patient taking differently: Place 2 sprays into both nostrils daily. As needed) 16 g 6   Glucos-MSM-C-Mn-Ginger-Willow (GLUCOSAMINE MSM COMPLEX PO) Take 1 tablet by mouth daily.     Lisdexamfetamine Dimesylate 50 MG CHEW Chew 1 tablet (50 mg total) by mouth daily. 30 tablet 0   Melatonin 3 MG TABS Take 3 mg by mouth at bedtime.     Multiple Vitamin (MULTIVITAMIN) tablet Take 1 tablet by  mouth daily.     naproxen (NAPROSYN) 250 MG tablet Take 250 mg by mouth 2 (two) times daily with a meal. Takes 220 mg     atorvastatin (LIPITOR) 40 MG tablet TAKE 1 TABLET (40 MG TOTAL) BY MOUTH DAILY. 90 tablet 0   levothyroxine (SYNTHROID) 137 MCG tablet TAKE 1 TABLET (137 MCG TOTAL) BY MOUTH DAILY BEFORE BREAKFAST. 90 tablet 0   lisinopril (ZESTRIL) 10 MG tablet Take 1 tablet (10 mg total) by mouth daily. 90 tablet 2   buprenorphine (BUTRANS) 15 MCG/HR 1 patch once a week.     cyclobenzaprine (FLEXERIL) 10 MG tablet 10 mg. Takes 0.5-1 tablet 3 times daily as needed.  Takes 0.5 tablet at bedtime     lisdexamfetamine  (VYVANSE) 40 MG capsule Take 1 capsule (40 mg total) by mouth daily. 30 capsule 0   No facility-administered medications prior to visit.     Per HPI unless specifically indicated in ROS section below Review of Systems  Constitutional:  Negative for activity change, appetite change, chills, fatigue, fever and unexpected weight change.  HENT:  Negative for hearing loss.   Eyes:  Negative for visual disturbance.  Respiratory:  Negative for cough, chest tightness, shortness of breath and wheezing.   Cardiovascular:  Negative for chest pain, palpitations and leg swelling.  Gastrointestinal:  Negative for abdominal distention, abdominal pain, blood in stool, constipation, diarrhea, nausea and vomiting.  Genitourinary:  Negative for difficulty urinating and hematuria.  Musculoskeletal:  Negative for arthralgias, myalgias and neck pain.  Skin:  Negative for rash.  Neurological:  Negative for dizziness, seizures, syncope and headaches.  Hematological:  Negative for adenopathy. Does not bruise/bleed easily.  Psychiatric/Behavioral:  Negative for dysphoric mood. The patient is not nervous/anxious.     Objective:  BP 134/68   Pulse 93   Temp (!) 97.5 F (36.4 C) (Temporal)   Ht 5' 11.5" (1.816 m)   Wt 221 lb 2 oz (100.3 kg)   SpO2 97%   BMI 30.41 kg/m   Wt Readings from Last 3 Encounters:  08/04/22 221 lb 2 oz (100.3 kg)  07/29/21 232 lb 6 oz (105.4 kg)  10/30/20 222 lb (100.7 kg)      Physical Exam Vitals and nursing note reviewed.  Constitutional:      General: He is not in acute distress.    Appearance: Normal appearance. He is well-developed. He is not ill-appearing.  HENT:     Head: Normocephalic and atraumatic.     Right Ear: Hearing, tympanic membrane, ear canal and external ear normal.     Left Ear: Hearing, tympanic membrane, ear canal and external ear normal.     Nose: Nose normal.     Mouth/Throat:     Mouth: Mucous membranes are moist.     Pharynx: Oropharynx is  clear. No oropharyngeal exudate or posterior oropharyngeal erythema.  Eyes:     General: No scleral icterus.    Extraocular Movements: Extraocular movements intact.     Conjunctiva/sclera: Conjunctivae normal.     Pupils: Pupils are equal, round, and reactive to light.  Neck:     Thyroid: No thyroid mass or thyromegaly.  Cardiovascular:     Rate and Rhythm: Normal rate and regular rhythm.     Pulses: Normal pulses.          Radial pulses are 2+ on the right side and 2+ on the left side.     Heart sounds: Normal heart sounds. No murmur heard. Pulmonary:  Effort: Pulmonary effort is normal. No respiratory distress.     Breath sounds: Normal breath sounds. No wheezing, rhonchi or rales.  Abdominal:     General: Bowel sounds are normal. There is no distension.     Palpations: Abdomen is soft. There is no mass.     Tenderness: There is no abdominal tenderness. There is no guarding or rebound.     Hernia: No hernia is present.  Genitourinary:    Prostate: Normal. Not enlarged, not tender and no nodules present.     Rectum: Normal. No mass, tenderness, anal fissure, external hemorrhoid or internal hemorrhoid. Normal anal tone.  Musculoskeletal:        General: Normal range of motion.     Cervical back: Normal range of motion and neck supple.     Right lower leg: No edema.     Left lower leg: No edema.     Comments:  No pain to palpation at bilateral achilles, or at insertion of achilles to calc, no obvious redness, swelling, warmth Neg Thompson test   Lymphadenopathy:     Cervical: No cervical adenopathy.  Skin:    General: Skin is warm and dry.     Findings: No rash.  Neurological:     General: No focal deficit present.     Mental Status: He is alert and oriented to person, place, and time.  Psychiatric:        Mood and Affect: Mood normal.        Behavior: Behavior normal.        Thought Content: Thought content normal.        Judgment: Judgment normal.       Results for  orders placed or performed in visit on 07/28/22  PSA  Result Value Ref Range   PSA 0.81 0.10 - 4.00 ng/mL  TSH  Result Value Ref Range   TSH 3.14 0.35 - 5.50 uIU/mL  Comprehensive metabolic panel  Result Value Ref Range   Sodium 139 135 - 145 mEq/L   Potassium 4.2 3.5 - 5.1 mEq/L   Chloride 101 96 - 112 mEq/L   CO2 30 19 - 32 mEq/L   Glucose, Bld 90 70 - 99 mg/dL   BUN 20 6 - 23 mg/dL   Creatinine, Ser 8.29 0.40 - 1.50 mg/dL   Total Bilirubin 0.5 0.2 - 1.2 mg/dL   Alkaline Phosphatase 50 39 - 117 U/L   AST 20 0 - 37 U/L   ALT 18 0 - 53 U/L   Total Protein 6.7 6.0 - 8.3 g/dL   Albumin 4.1 3.5 - 5.2 g/dL   GFR 56.21 >30.86 mL/min   Calcium 9.4 8.4 - 10.5 mg/dL  Lipid panel  Result Value Ref Range   Cholesterol 169 0 - 200 mg/dL   Triglycerides 578.4 0.0 - 149.0 mg/dL   HDL 69.62 >95.28 mg/dL   VLDL 41.3 0.0 - 24.4 mg/dL   LDL Cholesterol 96 0 - 99 mg/dL   Total CHOL/HDL Ratio 4    NonHDL 124.82     Assessment & Plan:   Problem List Items Addressed This Visit     Hyperlipidemia    Chronic, stable on atorvastatin - continue. The 10-year ASCVD risk score (Arnett DK, et al., 2019) is: 5.9%   Values used to calculate the score:     Age: 63 years     Sex: Male     Is Non-Hispanic African American: No     Diabetic: No  Tobacco smoker: No     Systolic Blood Pressure: 134 mmHg     Is BP treated: Yes     HDL Cholesterol: 44.1 mg/dL     Total Cholesterol: 169 mg/dL       Relevant Medications   atorvastatin (LIPITOR) 40 MG tablet   lisinopril (ZESTRIL) 10 MG tablet   Hypothyroidism    Chronic, stable period on levothyroxine - given recent formulary manufacturer change, will return in 2 months to repeat TSH.       Relevant Medications   levothyroxine (SYNTHROID) 137 MCG tablet   Other Relevant Orders   TSH   Essential hypertension, benign    Chronic, stable on lisinopril - continue.       Relevant Medications   atorvastatin (LIPITOR) 40 MG tablet    lisinopril (ZESTRIL) 10 MG tablet   Health maintenance examination - Primary (Chronic)    Preventative protocols reviewed and updated unless pt declined. Discussed healthy diet and lifestyle.       Cervical post-laminectomy syndrome    Sees pain clinic      Chronic pain syndrome    Sees Dr Philips pain clinic on butran patch      Attention deficit disorder without hyperactivity    Chronic, stable period on lisdexamphetamine 50mg  chewable tablet daily.  Trouble with availability of stimulants over the past few years.      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 Encouraged continued healthy diet and lifestyle efforts. He has been listening to [employer].       Injury of left Achilles tendon    Injury occurred ~6 months ago, slow to heal - discussed concern over possible partial tear vs chronic tendinopathy however it has been improving significantly over the past few weeks. Negative thompson test in office. If not improving, recommend sports medicine eval. He agrees with plan.       Other Visit Diagnoses     Special screening for malignant neoplasms, colon       Relevant Orders   Ambulatory referral to Gastroenterology        Meds ordered this encounter  Medications   atorvastatin (LIPITOR) 40 MG tablet    Sig: Take 1 tablet (40 mg total) by mouth daily.    Dispense:  90 tablet    Refill:  4   levothyroxine (SYNTHROID) 137 MCG tablet    Sig: Take 1 tablet (137 mcg total) by mouth daily before breakfast.    Dispense:  90 tablet    Refill:  4   lisinopril (ZESTRIL) 10 MG tablet    Sig: Take 1 tablet (10 mg total) by mouth daily.    Dispense:  90 tablet    Refill:  4    Orders Placed This Encounter  Procedures   TSH    Standing Status:   Future    Standing Expiration Date:   08/04/2023   Ambulatory referral to Gastroenterology    Referral Priority:   Routine    Referral Type:   Consultation    Referral Reason:   Specialty  Services Required    Number of Visits Requested:   1    Patient Instructions  Referral placed for repeat colonoscopy. You may call [facility] to schedule an appointment at [phone].  Good to see you today Return in 2 months for lab visit only (thyroid) Return in 1 year for next physical.   Follow up plan: Return in about 1  year (around 08/04/2023) for annual exam, prior fasting for blood work.  Eustaquio Boyden, MD

## 2022-08-04 NOTE — Assessment & Plan Note (Signed)
Chronic, stable on atorvastatin - continue. The 10-year ASCVD risk score (Arnett DK, et al., 2019) is: 5.9%   Values used to calculate the score:     Age: 56 years     Sex: Male     Is Non-Hispanic African American: No     Diabetic: No     Tobacco smoker: No     Systolic Blood Pressure: 134 mmHg     Is BP treated: Yes     HDL Cholesterol: 44.1 mg/dL     Total Cholesterol: 169 mg/dL

## 2022-08-04 NOTE — Assessment & Plan Note (Signed)
Chronic, stable period on lisdexamphetamine 50mg  chewable tablet daily.  Trouble with availability of stimulants over the past few years.

## 2022-08-04 NOTE — Assessment & Plan Note (Signed)
Sees pain clinic.  

## 2022-08-04 NOTE — Assessment & Plan Note (Signed)
Chronic, stable on lisinopril - continue.  

## 2022-08-04 NOTE — Assessment & Plan Note (Signed)
Preventative protocols reviewed and updated unless pt declined. Discussed healthy diet and lifestyle.  

## 2022-08-04 NOTE — Assessment & Plan Note (Signed)
Weight down 10 lbs! Encouraged continued healthy diet and lifestyle efforts. He has been listening to [employer].

## 2022-08-04 NOTE — Assessment & Plan Note (Signed)
Chronic, stable period on levothyroxine - given recent formulary manufacturer change, will return in 2 months to repeat TSH.

## 2022-08-04 NOTE — Assessment & Plan Note (Signed)
Sees Dr Philips pain clinic on butran patch

## 2022-08-04 NOTE — Assessment & Plan Note (Addendum)
Injury occurred ~6 months ago, slow to heal - discussed concern over possible partial tear vs chronic tendinopathy however it has been improving significantly over the past few weeks. Negative thompson test in office. If not improving, recommend sports medicine eval. He agrees with plan.

## 2022-08-04 NOTE — Patient Instructions (Addendum)
Referral placed for repeat colonoscopy. You may call [facility] to schedule an appointment at [phone].  Good to see you today Return in 2 months for lab visit only (thyroid) Return in 1 year for next physical.

## 2022-08-18 ENCOUNTER — Other Ambulatory Visit: Payer: Self-pay | Admitting: Family Medicine

## 2022-08-18 ENCOUNTER — Other Ambulatory Visit (HOSPITAL_COMMUNITY): Payer: Self-pay

## 2022-08-18 MED ORDER — LISDEXAMFETAMINE DIMESYLATE 50 MG PO CHEW
1.0000 | CHEWABLE_TABLET | Freq: Every day | ORAL | 0 refills | Status: DC
  Filled 2022-08-18: qty 30, 30d supply, fill #0

## 2022-08-18 NOTE — Telephone Encounter (Signed)
Message from pt via pharmacy:  Can you please instruct the pharmacy to allow me to fill this on Friday June 7th (instead of the 9th)?  I will be leaving to go to the beach for a week on June 8th.  Thank you!

## 2022-08-18 NOTE — Telephone Encounter (Signed)
ERx 

## 2022-08-19 ENCOUNTER — Other Ambulatory Visit: Payer: Self-pay

## 2022-08-19 ENCOUNTER — Other Ambulatory Visit (HOSPITAL_COMMUNITY): Payer: Self-pay

## 2022-08-21 ENCOUNTER — Other Ambulatory Visit (HOSPITAL_COMMUNITY): Payer: Self-pay

## 2022-09-03 ENCOUNTER — Other Ambulatory Visit (HOSPITAL_COMMUNITY): Payer: Self-pay

## 2022-09-03 MED ORDER — BUPRENORPHINE 15 MCG/HR TD PTWK
1.0000 | MEDICATED_PATCH | TRANSDERMAL | 3 refills | Status: DC
  Filled 2022-09-15: qty 4, 28d supply, fill #0
  Filled 2022-10-21: qty 4, 28d supply, fill #1
  Filled 2022-11-18 – 2022-11-20 (×2): qty 4, 28d supply, fill #2

## 2022-09-03 MED ORDER — CYCLOBENZAPRINE HCL 5 MG PO TABS: 5.0000 mg | ORAL_TABLET | Freq: Three times a day (TID) | ORAL | 2 refills | Status: DC | PRN

## 2022-09-09 ENCOUNTER — Encounter: Payer: Self-pay | Admitting: Gastroenterology

## 2022-09-15 ENCOUNTER — Other Ambulatory Visit (HOSPITAL_COMMUNITY): Payer: Self-pay

## 2022-09-15 ENCOUNTER — Encounter: Payer: Self-pay | Admitting: Gastroenterology

## 2022-09-15 ENCOUNTER — Other Ambulatory Visit: Payer: Self-pay

## 2022-09-15 ENCOUNTER — Ambulatory Visit (AMBULATORY_SURGERY_CENTER): Payer: BC Managed Care – PPO

## 2022-09-15 VITALS — Ht 73.0 in | Wt 220.0 lb

## 2022-09-15 DIAGNOSIS — Z86010 Personal history of colonic polyps: Secondary | ICD-10-CM

## 2022-09-15 NOTE — Progress Notes (Signed)
Denies allergies to eggs or soy products. Denies complication of anesthesia or sedation. Denies use of weight loss medication. Denies use of O2.   Emmi instructions given for colonoscopy.  

## 2022-09-16 ENCOUNTER — Other Ambulatory Visit: Payer: Self-pay | Admitting: Family Medicine

## 2022-09-16 DIAGNOSIS — F988 Other specified behavioral and emotional disorders with onset usually occurring in childhood and adolescence: Secondary | ICD-10-CM

## 2022-09-17 ENCOUNTER — Other Ambulatory Visit (HOSPITAL_COMMUNITY): Payer: Self-pay

## 2022-09-17 MED ORDER — LISDEXAMFETAMINE DIMESYLATE 50 MG PO CHEW
1.0000 | CHEWABLE_TABLET | Freq: Every day | ORAL | 0 refills | Status: DC
Start: 2022-09-17 — End: 2022-10-10
  Filled 2022-09-17 – 2022-09-19 (×2): qty 30, 30d supply, fill #0

## 2022-09-17 NOTE — Telephone Encounter (Signed)
ERx 

## 2022-09-17 NOTE — Telephone Encounter (Signed)
Name of Medication: Vyvanse chew Name of Pharmacy: [facility] Last Fill or Written Date and Quantity: 08/22/22, #30 Last Office Visit and Type: 08/04/22, CPE Next Office Visit and Type:  none Last Controlled Substance Agreement Date: 02/09/17 Last UDS:  04/07/18

## 2022-09-18 MED ORDER — LISDEXAMFETAMINE DIMESYLATE 50 MG PO CAPS
50.0000 mg | ORAL_CAPSULE | Freq: Every day | ORAL | 0 refills | Status: DC
  Filled 2022-09-18 – 2022-09-22 (×2): qty 30, 30d supply, fill #0

## 2022-09-18 NOTE — Telephone Encounter (Signed)
Received message from pharmacy that chewable not in stock - request 50mg  capsules sent instead

## 2022-09-18 NOTE — Addendum Note (Signed)
Addended by: Eustaquio Boyden on: 09/18/2022 08:16 AM   Modules accepted: Orders

## 2022-09-19 ENCOUNTER — Other Ambulatory Visit (HOSPITAL_COMMUNITY): Payer: Self-pay

## 2022-09-23 ENCOUNTER — Other Ambulatory Visit (HOSPITAL_COMMUNITY): Payer: Self-pay

## 2022-09-23 ENCOUNTER — Other Ambulatory Visit: Payer: Self-pay

## 2022-09-23 ENCOUNTER — Telehealth: Payer: Self-pay

## 2022-09-23 NOTE — Telephone Encounter (Signed)
Received MyChart message from pt stating Vyvanse 50 mg capsule needs PA. Plz submit.

## 2022-09-23 NOTE — Telephone Encounter (Signed)
Sent sent PA request to PA Team. (See 09/23/22 phn note.)

## 2022-09-24 ENCOUNTER — Telehealth: Payer: Self-pay

## 2022-09-24 NOTE — Telephone Encounter (Signed)
Pharmacy Patient Advocate Encounter   PA initiated to [facility] via CoverMyMeds Waiting on clinical questions  Key or (Medicaid) confirmation # [id number] Status is pending

## 2022-09-24 NOTE — Telephone Encounter (Signed)
PA initiated via CMM. Waiting on clinical questions.Created new encounter for PA. Will route back to pool once determination has been made.

## 2022-09-25 ENCOUNTER — Other Ambulatory Visit (HOSPITAL_COMMUNITY): Payer: Self-pay

## 2022-09-25 NOTE — Telephone Encounter (Signed)
Questions answered. PA submitted. Status pending. 

## 2022-09-26 ENCOUNTER — Other Ambulatory Visit (HOSPITAL_COMMUNITY): Payer: Self-pay

## 2022-09-26 NOTE — Telephone Encounter (Signed)
Pharmacy Patient Advocate Encounter  Received notification from [facility] that Prior Authorization for Lisdexamfetamine Dimesylate 50MG  capsules has been APPROVED from 09/25/22 to 09/24/25.Marland Kitchen  PA #/Case ID/Reference #: [id number]

## 2022-10-03 ENCOUNTER — Other Ambulatory Visit: Payer: BC Managed Care – PPO

## 2022-10-09 ENCOUNTER — Encounter: Payer: Self-pay | Admitting: Certified Registered Nurse Anesthetist

## 2022-10-10 ENCOUNTER — Encounter: Payer: Self-pay | Admitting: Gastroenterology

## 2022-10-10 ENCOUNTER — Ambulatory Visit (AMBULATORY_SURGERY_CENTER): Payer: BC Managed Care – PPO | Admitting: Gastroenterology

## 2022-10-10 VITALS — BP 122/76 | HR 69 | Temp 98.6°F | Resp 19 | Ht 71.5 in | Wt 220.0 lb

## 2022-10-10 DIAGNOSIS — K640 First degree hemorrhoids: Secondary | ICD-10-CM

## 2022-10-10 DIAGNOSIS — Z09 Encounter for follow-up examination after completed treatment for conditions other than malignant neoplasm: Secondary | ICD-10-CM | POA: Diagnosis present

## 2022-10-10 DIAGNOSIS — D125 Benign neoplasm of sigmoid colon: Secondary | ICD-10-CM | POA: Diagnosis not present

## 2022-10-10 DIAGNOSIS — Z86010 Personal history of colonic polyps: Secondary | ICD-10-CM | POA: Diagnosis not present

## 2022-10-10 DIAGNOSIS — K635 Polyp of colon: Secondary | ICD-10-CM

## 2022-10-10 MED ORDER — SODIUM CHLORIDE 0.9 % IV SOLN: 500.0000 mL | Freq: Once | INTRAVENOUS | Status: DC

## 2022-10-10 NOTE — Patient Instructions (Addendum)
Continue present medications. Await pathology results. Repeat colonoscopy for surveillance based on pathology results. Return to GI office as needed  Please read over handouts about polyps and hemorrhoids.  YOU HAD AN ENDOSCOPIC PROCEDURE TODAY AT THE [facility]:   Refer to the procedure report that was given to you for any specific questions about what was found during the examination.  If the procedure report does not answer your questions, please call your gastroenterologist to clarify.  If you requested that your care partner not be given the details of your procedure findings, then the procedure report has been included in a sealed envelope for you to review at your convenience later.  YOU SHOULD EXPECT: Some feelings of bloating in the abdomen. Passage of more gas than usual.  Walking can help get rid of the air that was put into your GI tract during the procedure and reduce the bloating. If you had a lower endoscopy (such as a colonoscopy or flexible sigmoidoscopy) you may notice spotting of blood in your stool or on the toilet paper. If you underwent a bowel prep for your procedure, you may not have a normal bowel movement for a few days.  Please Note:  You might notice some irritation and congestion in your nose or some drainage.  This is from the oxygen used during your procedure.  There is no need for concern and it should clear up in a day or so.  SYMPTOMS TO REPORT IMMEDIATELY:  Following lower endoscopy (colonoscopy or flexible sigmoidoscopy):  Excessive amounts of blood in the stool  Significant tenderness or worsening of abdominal pains  Swelling of the abdomen that is new, acute  Fever of 100F or higher  For urgent or emergent issues, a gastroenterologist can be reached at any hour by calling [phone]. Do not use MyChart messaging for urgent concerns.    DIET:  We do recommend a small meal at first, but then you may proceed to your regular diet.  Drink  plenty of fluids but you should avoid alcoholic beverages for 24 hours.  ACTIVITY:  You should plan to take it easy for the rest of today and you should NOT DRIVE or use heavy machinery until tomorrow (because of the sedation medicines used during the test).    FOLLOW UP: Our staff will call the number listed on your records the next business day following your procedure.  We will call around 7:15- 8:00 am to check on you and address any questions or concerns that you may have regarding the information given to you following your procedure. If we do not reach you, we will leave a message.     If any biopsies were taken you will be contacted by phone or by letter within the next 1-3 weeks.  Please call us at [phone] if you have not heard about the biopsies in 3 weeks.    SIGNATURES/CONFIDENTIALITY: You and/or your care partner have signed paperwork which will be entered into your electronic medical record.  These signatures attest to the fact that that the information above on your After Visit Summary has been reviewed and is understood.  Full responsibility of the confidentiality of this discharge information lies with you and/or your care-partner.

## 2022-10-10 NOTE — Progress Notes (Signed)
Pt's states no medical or surgical changes since previsit or office visit. 

## 2022-10-10 NOTE — Op Note (Signed)
[facility] Patient Name: Chase Burke Procedure Date: 10/10/2022 2:16 PM MRN: [id number] Endoscopist: Doristine Locks , MD, [id number] Age: 57 Referring MD:  Date of Birth: Dec 31, 1965 Gender: Male Account #: [id number] Procedure:                Colonoscopy Indications:              Surveillance: Personal history of adenomatous                            polyps on last colonoscopy 3 years ago                           Last colonoscopy was 01/2019 and notable for 5                            subcentimeter adenomas, with recommendation to                            repeat in 3 years. Medicines:                Monitored Anesthesia Care Procedure:                Pre-Anesthesia Assessment:                           - Prior to the procedure, a History and Physical                            was performed, and patient medications and                            allergies were reviewed. The patient's tolerance of                            previous anesthesia was also reviewed. The risks                            and benefits of the procedure and the sedation                            options and risks were discussed with the patient.                            All questions were answered, and informed consent                            was obtained. Prior Anticoagulants: The patient has                            taken no anticoagulant or antiplatelet agents. ASA                            Grade Assessment: II - A patient with mild systemic  disease. After reviewing the risks and benefits,                            the patient was deemed in satisfactory condition to                            undergo the procedure.                           After obtaining informed consent, the colonoscope                            was passed under direct vision. Throughout the                            procedure, the patient's blood pressure, pulse, and                             oxygen saturations were monitored continuously. The                            CF [id number] #[id number] was introduced through the anus                            and advanced to the the terminal ileum. The                            colonoscopy was performed without difficulty. The                            patient tolerated the procedure well. The quality                            of the bowel preparation was good. The terminal                            ileum, ileocecal valve, appendiceal orifice, and                            rectum were photographed. Scope In: 2:18:32 PM Scope Out: 2:34:46 PM Scope Withdrawal Time: 0 hours 13 minutes 11 seconds  Total Procedure Duration: 0 hours 16 minutes 14 seconds  Findings:                 The perianal and digital rectal examinations were                            normal.                           Two sessile polyps were found in the sigmoid colon.                            The polyps were 2 to 4 mm in size. These polyps  were removed with a cold snare. Resection and                            retrieval were complete. Estimated blood loss was                            minimal.                           Non-bleeding internal hemorrhoids were found during                            retroflexion. The hemorrhoids were small.                           The terminal ileum appeared normal. Complications:            No immediate complications. Estimated Blood Loss:     Estimated blood loss was minimal. Impression:               - Two 2 to 4 mm polyps in the sigmoid colon,                            removed with a cold snare. Resected and retrieved.                           - Non-bleeding internal hemorrhoids.                           - The examined portion of the ileum was normal. Recommendation:           - Patient has a contact number available for                            emergencies. The signs and symptoms of  potential                            delayed complications were discussed with the                            patient. Return to normal activities tomorrow.                            Written discharge instructions were provided to the                            patient.                           - Resume previous diet.                           - Continue present medications.                           - Await pathology results.                           -  Repeat colonoscopy for surveillance based on                            pathology results.                           - Return to GI office PRN. Doristine Locks, MD 10/10/2022 2:53:06 PM

## 2022-10-10 NOTE — Progress Notes (Signed)
Report given to PACU, vss 

## 2022-10-10 NOTE — Progress Notes (Signed)
GASTROENTEROLOGY PROCEDURE H&P NOTE   Primary Care Physician: Eustaquio Boyden, MD    Reason for Procedure:  Colon polyp surveillance  Plan:    Colonoscopy  Patient is appropriate for endoscopic procedure(s) in the ambulatory ([facility]) setting.  The nature of the procedure, as well as the risks, benefits, and alternatives were carefully and thoroughly reviewed with the patient. Ample time for discussion and questions allowed. The patient understood, was satisfied, and agreed to proceed.     HPI: Chase Burke is a 57 y.o. male who presents for colonoscopy for ongoing colon polyp surveillance. Last colonoscopy was 01/2019 and notable for 5 subcentimeter adenomas, with recommendation to repeat in 3 years.   No known family history of colon cancer or related malignancy.  Patient is otherwise without complaints or active issues today.  Past Medical History:  Diagnosis Date   ADD (attention deficit disorder with hyperactivity)    Allergy    Anxiety    Asthma    Chronic pain    COVID-19 virus infection 10/30/2020   DDD (degenerative disc disease), cervical    sees Dr Thyra Breed   GERD (gastroesophageal reflux disease)    Hyperlipidemia    Hypertension    Hypothyroidism    Neuromuscular disorder [facility])     Past Surgical History:  Procedure Laterality Date   BREAST SURGERY     reduction   CERVICAL DISC SURGERY  07/2007   C4/5 and C5/6 ([facility])   COLONOSCOPY  01/2019   5 TAs, rpt 3 yrs Christella Hartigan)    SHOULDER SURGERY Right 2000   Supple   SHOULDER SURGERY Left 2008   Supple (torn labrum)   TONSILLECTOMY      Prior to Admission medications   Medication Sig Start Date End Date Taking? Authorizing Provider  aspirin EC 81 MG tablet Take 1 tablet (81 mg total) by mouth every other day. 07/29/21  Yes Eustaquio Boyden, MD  atorvastatin (LIPITOR) 40 MG tablet Take 1 tablet (40 mg total) by mouth daily. 08/04/22  Yes Eustaquio Boyden, MD  buprenorphine Lavera Guise) 15 MCG/HR Place  1 patch onto the skin once a week. 09/03/22  Yes   cetirizine (ZYRTEC) 10 MG tablet Take 10 mg by mouth daily.   Yes [provider]  cyclobenzaprine (FLEXERIL) 5 MG tablet Take 1 tablet (5 mg total) by mouth 3 (three) times daily as needed. 09/03/22  Yes   diphenhydrAMINE (BENADRYL) 25 MG tablet Take 25-50 mg by mouth at bedtime as needed for sleep.    Yes [provider]  Glucos-MSM-C-Mn-Ginger-Willow (GLUCOSAMINE MSM COMPLEX PO) Take 1 tablet by mouth daily.   Yes [provider]  levothyroxine (SYNTHROID) 137 MCG tablet Take 1 tablet (137 mcg total) by mouth daily before breakfast. 08/04/22  Yes Eustaquio Boyden, MD  lisdexamfetamine (VYVANSE) 50 MG capsule Take 1 capsule (50 mg total) by mouth daily. 09/18/22  Yes Eustaquio Boyden, MD  lisinopril (ZESTRIL) 10 MG tablet Take 1 tablet (10 mg total) by mouth daily. 08/04/22  Yes Eustaquio Boyden, MD  Multiple Vitamin (MULTIVITAMIN) tablet Take 1 tablet by mouth daily.   Yes [provider]  polyethylene glycol powder (GLYCOLAX/MIRALAX) 17 GM/SCOOP powder Take 1 Container by mouth once.   Yes [provider]  fluticasone (FLONASE) 50 MCG/ACT nasal spray Place 2 sprays into both nostrils daily. Patient taking differently: Place 2 sprays into both nostrils daily. As needed 01/12/14   Roderick Pee, MD  Melatonin 3 MG TABS Take 3 mg by mouth at bedtime.  [provider]  naproxen (NAPROSYN) 250 MG tablet Take 250 mg by mouth 2 (two) times daily with a meal. Takes 220 mg    [provider]    Current Outpatient Medications  Medication Sig Dispense Refill   aspirin EC 81 MG tablet Take 1 tablet (81 mg total) by mouth every other day.     atorvastatin (LIPITOR) 40 MG tablet Take 1 tablet (40 mg total) by mouth daily. 90 tablet 4   buprenorphine (BUTRANS) 15 MCG/HR Place 1 patch onto the skin once a week. 4 patch 3   cetirizine (ZYRTEC) 10 MG tablet Take 10 mg by mouth daily.      cyclobenzaprine (FLEXERIL) 5 MG tablet Take 1 tablet (5 mg total) by mouth 3 (three) times daily as needed. 90 tablet 2   diphenhydrAMINE (BENADRYL) 25 MG tablet Take 25-50 mg by mouth at bedtime as needed for sleep.      Glucos-MSM-C-Mn-Ginger-Willow (GLUCOSAMINE MSM COMPLEX PO) Take 1 tablet by mouth daily.     levothyroxine (SYNTHROID) 137 MCG tablet Take 1 tablet (137 mcg total) by mouth daily before breakfast. 90 tablet 4   lisdexamfetamine (VYVANSE) 50 MG capsule Take 1 capsule (50 mg total) by mouth daily. 30 capsule 0   lisinopril (ZESTRIL) 10 MG tablet Take 1 tablet (10 mg total) by mouth daily. 90 tablet 4   Multiple Vitamin (MULTIVITAMIN) tablet Take 1 tablet by mouth daily.     polyethylene glycol powder (GLYCOLAX/MIRALAX) 17 GM/SCOOP powder Take 1 Container by mouth once.     fluticasone (FLONASE) 50 MCG/ACT nasal spray Place 2 sprays into both nostrils daily. (Patient taking differently: Place 2 sprays into both nostrils daily. As needed) 16 g 6   Melatonin 3 MG TABS Take 3 mg by mouth at bedtime.     naproxen (NAPROSYN) 250 MG tablet Take 250 mg by mouth 2 (two) times daily with a meal. Takes 220 mg     Current Facility-Administered Medications  Medication Dose Route Frequency Provider Last Rate Last Admin   0.9 %  sodium chloride infusion  500 mL Intravenous Once Elliotte Marsalis V, DO        Allergies as of 10/10/2022 - Review Complete 10/10/2022  Allergen Reaction Noted   Ibuprofen Other (See Comments) 08/09/2014   Influenza vaccines Other (See Comments) 08/21/2015    Family History  Problem Relation Age of Onset   Cancer Father 39       prostate   Stroke Father 2       several   Diabetes Paternal Grandmother    Heart disease Paternal Grandfather        young age, unknown details   Arthritis Other        fhx   Hyperlipidemia Other        strong fmhx   Hypertension Other        strong fmhx   Colon cancer Neg Hx    Esophageal cancer Neg Hx    Rectal cancer Neg  Hx    Stomach cancer Neg Hx     Social History   Socioeconomic History   Marital status: Married    Spouse name: Not on file   Number of children: Not on file   Years of education: Not on file   Highest education level: Not on file  Occupational History   Not on file  Tobacco Use   Smoking status: Former    Current packs/day: 0.00    Average packs/day: 0.5 packs/day for  33.0 years (16.5 ttl pk-yrs)    Types: Cigarettes    Start date: 03/17/1984    Quit date: 03/18/2007    Years since quitting: 15.5   Smokeless tobacco: Never  Substance and Sexual Activity   Alcohol use: Yes    Alcohol/week: 0.0 standard drinks of alcohol    Comment: 1 drink daily   Drug use: No   Sexual activity: Not on file  Other Topics Concern   Not on file  Social History Narrative   Lives with wife and son, no pets    Edu: PhD    Occ: Economy Professor at [employer]    Activity: yardwork. Walking some on treadmill   Diet: good water, fruits/vegetables daily   Social Determinants of Health   Financial Resource Strain: Not on file  Food Insecurity: Not on file  Transportation Needs: Not on file  Physical Activity: Not on file  Stress: Not on file  Social Connections: Not on file  Intimate Partner Violence: Not on file    Physical Exam: Vital signs in last 24 hours: @BP  122/76   Pulse 78   Temp 98.6 F (37 C) (Temporal)   Ht 5' 11.5" (1.816 m)   Wt 220 lb (99.8 kg)   SpO2 99%   BMI 30.26 kg/m  GEN: NAD EYE: Sclerae anicteric ENT: MMM CV: Non-tachycardic Pulm: CTA b/l GI: Soft, NT/ND NEURO:  Alert & Oriented x 3   Doristine Locks, DO [facility]   10/10/2022 2:10 PM

## 2022-10-10 NOTE — Progress Notes (Signed)
Called to room to assist during endoscopic procedure.  Patient ID and intended procedure confirmed with present staff. Received instructions for my participation in the procedure from the performing physician.  

## 2022-10-13 ENCOUNTER — Telehealth: Payer: Self-pay

## 2022-10-13 NOTE — Telephone Encounter (Signed)
  Follow up Call-     10/10/2022    2:02 PM  Call back number  Post procedure Call Back phone  # [phone]  Permission to leave phone message Yes     Patient questions:  Do you have a fever, pain , or abdominal swelling? No. Pain Score  0 *  Have you tolerated food without any problems? Yes.    Have you been able to return to your normal activities? Yes.    Do you have any questions about your discharge instructions: Diet   No. Medications  No. Follow up visit  No.  Do you have questions or concerns about your Care? No.  Actions: * If pain score is 4 or above: No action needed, pain <4.

## 2022-10-22 ENCOUNTER — Other Ambulatory Visit: Payer: Self-pay

## 2022-10-22 ENCOUNTER — Other Ambulatory Visit: Payer: Self-pay | Admitting: Family Medicine

## 2022-10-22 DIAGNOSIS — E785 Hyperlipidemia, unspecified: Secondary | ICD-10-CM

## 2022-10-22 DIAGNOSIS — F988 Other specified behavioral and emotional disorders with onset usually occurring in childhood and adolescence: Secondary | ICD-10-CM

## 2022-10-22 DIAGNOSIS — E039 Hypothyroidism, unspecified: Secondary | ICD-10-CM

## 2022-10-22 NOTE — Telephone Encounter (Signed)
Name of Medication:  Vyvanse Name of Pharmacy:  [facility] Last Fill or Written Date and Quantity:  09/25/22, #30 Last Office Visit and Type:  08/04/22, CPE Next Office Visit and Type:  none Last Controlled Substance Agreement Date:  02/09/17 Last UDS:  04/07/18

## 2022-10-23 ENCOUNTER — Other Ambulatory Visit (HOSPITAL_COMMUNITY): Payer: Self-pay

## 2022-10-23 ENCOUNTER — Other Ambulatory Visit: Payer: Self-pay

## 2022-10-23 MED ORDER — LISDEXAMFETAMINE DIMESYLATE 50 MG PO CAPS
50.0000 mg | ORAL_CAPSULE | Freq: Every day | ORAL | 0 refills | Status: DC
Start: 2022-10-23 — End: 2022-11-21
  Filled 2022-10-23: qty 30, 30d supply, fill #0

## 2022-10-23 NOTE — Telephone Encounter (Signed)
ERx 

## 2022-11-18 ENCOUNTER — Other Ambulatory Visit: Payer: Self-pay

## 2022-11-18 ENCOUNTER — Other Ambulatory Visit (HOSPITAL_COMMUNITY): Payer: Self-pay

## 2022-11-19 ENCOUNTER — Other Ambulatory Visit: Payer: Self-pay

## 2022-11-19 ENCOUNTER — Other Ambulatory Visit (HOSPITAL_COMMUNITY): Payer: Self-pay

## 2022-11-21 ENCOUNTER — Other Ambulatory Visit: Payer: Self-pay | Admitting: Family Medicine

## 2022-11-21 ENCOUNTER — Other Ambulatory Visit (HOSPITAL_COMMUNITY): Payer: Self-pay

## 2022-11-21 DIAGNOSIS — F988 Other specified behavioral and emotional disorders with onset usually occurring in childhood and adolescence: Secondary | ICD-10-CM

## 2022-11-21 MED ORDER — LISDEXAMFETAMINE DIMESYLATE 50 MG PO CAPS
50.0000 mg | ORAL_CAPSULE | Freq: Every day | ORAL | 0 refills | Status: DC
Start: 2022-11-21 — End: 2022-12-22
  Filled 2022-11-21: qty 30, 30d supply, fill #0

## 2022-11-21 NOTE — Telephone Encounter (Signed)
Name of Medication:  Vyvanse Name of Pharmacy:  [facility] Last Fill or Written Date and Quantity:  10/23/22, #30 Last Office Visit and Type:  08/04/22, CPE Next Office Visit and Type:  none Last Controlled Substance Agreement Date:  02/09/17 Last UDS:  04/07/18

## 2022-11-21 NOTE — Telephone Encounter (Signed)
ERx 

## 2022-12-01 ENCOUNTER — Other Ambulatory Visit (HOSPITAL_COMMUNITY): Payer: Self-pay

## 2022-12-01 MED ORDER — BUPRENORPHINE 15 MCG/HR TD PTWK
1.0000 | MEDICATED_PATCH | TRANSDERMAL | 3 refills | Status: AC
Start: 2022-12-01 — End: ?
  Filled 2022-12-01 – 2022-12-16 (×2): qty 4, 28d supply, fill #0
  Filled 2023-01-13: qty 4, 28d supply, fill #1
  Filled 2023-02-04: qty 4, 28d supply, fill #2

## 2022-12-01 MED ORDER — CYCLOBENZAPRINE HCL 5 MG PO TABS
5.0000 mg | ORAL_TABLET | Freq: Three times a day (TID) | ORAL | 2 refills | Status: DC | PRN
  Filled 2022-12-01: qty 90, 30d supply, fill #0

## 2022-12-16 ENCOUNTER — Other Ambulatory Visit: Payer: Self-pay

## 2022-12-16 ENCOUNTER — Other Ambulatory Visit (HOSPITAL_COMMUNITY): Payer: Self-pay

## 2022-12-17 ENCOUNTER — Other Ambulatory Visit (HOSPITAL_COMMUNITY): Payer: Self-pay

## 2022-12-18 ENCOUNTER — Other Ambulatory Visit (HOSPITAL_COMMUNITY): Payer: Self-pay

## 2022-12-19 ENCOUNTER — Other Ambulatory Visit: Payer: BC Managed Care – PPO

## 2022-12-22 ENCOUNTER — Other Ambulatory Visit (INDEPENDENT_AMBULATORY_CARE_PROVIDER_SITE_OTHER): Payer: BC Managed Care – PPO

## 2022-12-22 ENCOUNTER — Other Ambulatory Visit (HOSPITAL_COMMUNITY): Payer: Self-pay

## 2022-12-22 ENCOUNTER — Encounter: Payer: Self-pay | Admitting: Family Medicine

## 2022-12-22 DIAGNOSIS — E039 Hypothyroidism, unspecified: Secondary | ICD-10-CM | POA: Diagnosis not present

## 2022-12-22 DIAGNOSIS — F988 Other specified behavioral and emotional disorders with onset usually occurring in childhood and adolescence: Secondary | ICD-10-CM

## 2022-12-22 LAB — TSH: TSH: 4.38 u[IU]/mL (ref 0.35–5.50)

## 2022-12-22 MED ORDER — LISDEXAMFETAMINE DIMESYLATE 50 MG PO CAPS
50.0000 mg | ORAL_CAPSULE | Freq: Every day | ORAL | 0 refills | Status: DC
Start: 2022-12-22 — End: 2023-01-18
  Filled 2022-12-22: qty 30, 30d supply, fill #0

## 2022-12-22 NOTE — Telephone Encounter (Signed)
Name of Medication:  Vyvanse Name of Pharmacy:  [facility] Last Fill or Written Date and Quantity:  10/23/22, #30 Last Office Visit and Type:  08/04/22, CPE Next Office Visit and Type:  none Last Controlled Substance Agreement Date:  02/09/17 Last UDS:  04/07/18

## 2022-12-22 NOTE — Telephone Encounter (Signed)
ERx 

## 2022-12-24 MED ORDER — LEVOTHYROXINE SODIUM 150 MCG PO TABS: 150.0000 ug | ORAL_TABLET | Freq: Every day | ORAL | 1 refills | Status: DC

## 2023-01-05 ENCOUNTER — Other Ambulatory Visit: Payer: Self-pay | Admitting: Family Medicine

## 2023-01-05 DIAGNOSIS — E039 Hypothyroidism, unspecified: Secondary | ICD-10-CM

## 2023-01-14 ENCOUNTER — Other Ambulatory Visit: Payer: Self-pay

## 2023-01-14 ENCOUNTER — Other Ambulatory Visit (HOSPITAL_COMMUNITY): Payer: Self-pay

## 2023-01-15 ENCOUNTER — Other Ambulatory Visit: Payer: Self-pay

## 2023-01-18 ENCOUNTER — Other Ambulatory Visit: Payer: Self-pay | Admitting: Family Medicine

## 2023-01-18 DIAGNOSIS — F988 Other specified behavioral and emotional disorders with onset usually occurring in childhood and adolescence: Secondary | ICD-10-CM

## 2023-01-19 NOTE — Telephone Encounter (Signed)
Refill request for   lisdexamfetamine (VYVANSE) 50 MG capsule      LOV - 08/04/22 Next OV - not scheduled Last refill - 12/22/22 #30/0

## 2023-01-20 ENCOUNTER — Other Ambulatory Visit (HOSPITAL_COMMUNITY): Payer: Self-pay

## 2023-01-20 MED ORDER — LISDEXAMFETAMINE DIMESYLATE 50 MG PO CAPS
50.0000 mg | ORAL_CAPSULE | Freq: Every day | ORAL | 0 refills | Status: DC
Start: 2023-01-20 — End: 2023-02-19
  Filled 2023-01-20: qty 30, 30d supply, fill #0

## 2023-01-20 NOTE — Telephone Encounter (Signed)
ERx 

## 2023-01-30 ENCOUNTER — Other Ambulatory Visit (HOSPITAL_COMMUNITY): Payer: Self-pay

## 2023-02-02 ENCOUNTER — Other Ambulatory Visit (HOSPITAL_COMMUNITY): Payer: Self-pay

## 2023-02-03 ENCOUNTER — Other Ambulatory Visit (HOSPITAL_COMMUNITY): Payer: Self-pay

## 2023-02-04 ENCOUNTER — Other Ambulatory Visit (HOSPITAL_COMMUNITY): Payer: Self-pay

## 2023-02-19 ENCOUNTER — Other Ambulatory Visit: Payer: Self-pay | Admitting: Family Medicine

## 2023-02-19 DIAGNOSIS — F988 Other specified behavioral and emotional disorders with onset usually occurring in childhood and adolescence: Secondary | ICD-10-CM

## 2023-02-20 ENCOUNTER — Other Ambulatory Visit (HOSPITAL_COMMUNITY): Payer: Self-pay

## 2023-02-20 ENCOUNTER — Encounter: Payer: Self-pay | Admitting: Family Medicine

## 2023-02-20 MED ORDER — LISDEXAMFETAMINE DIMESYLATE 50 MG PO CAPS
50.0000 mg | ORAL_CAPSULE | Freq: Every day | ORAL | 0 refills | Status: DC
Start: 2023-04-23 — End: 2023-07-22
  Filled 2023-06-18: qty 30, 30d supply, fill #0

## 2023-02-20 MED ORDER — LISDEXAMFETAMINE DIMESYLATE 50 MG PO CAPS
50.0000 mg | ORAL_CAPSULE | Freq: Every day | ORAL | 0 refills | Status: DC
Start: 2023-03-23 — End: 2023-07-23
  Filled 2023-05-21: qty 30, 30d supply, fill #0

## 2023-02-20 MED ORDER — LISDEXAMFETAMINE DIMESYLATE 50 MG PO CAPS
50.0000 mg | ORAL_CAPSULE | Freq: Every day | ORAL | 0 refills | Status: DC
Start: 2023-02-20 — End: 2023-03-20
  Filled 2023-02-20: qty 30, 30d supply, fill #0

## 2023-02-20 NOTE — Telephone Encounter (Signed)
Name of Medication:  Vyvanse Name of Pharmacy:  [facility] Last Fill or Written Date and Quantity:  01/20/23, #30 Last Office Visit and Type:  08/04/22, CPE Next Office Visit and Type:  none Last Controlled Substance Agreement Date:  02/09/17 Last UDS:  04/07/18

## 2023-02-20 NOTE — Telephone Encounter (Signed)
ERx 

## 2023-02-23 ENCOUNTER — Other Ambulatory Visit (HOSPITAL_COMMUNITY): Payer: Self-pay

## 2023-02-23 MED ORDER — CYCLOBENZAPRINE HCL 5 MG PO TABS
5.0000 mg | ORAL_TABLET | Freq: Three times a day (TID) | ORAL | 2 refills | Status: DC | PRN
  Filled 2023-02-23: qty 90, 30d supply, fill #0
  Filled 2023-05-18: qty 90, 30d supply, fill #1

## 2023-02-23 MED ORDER — BUPRENORPHINE 15 MCG/HR TD PTWK
1.0000 | MEDICATED_PATCH | TRANSDERMAL | 3 refills | Status: DC
Start: 2023-02-23 — End: 2023-07-22
  Filled 2023-03-14: qty 4, 28d supply, fill #0
  Filled 2023-04-11: qty 4, 28d supply, fill #1
  Filled 2023-05-08: qty 4, 28d supply, fill #2
  Filled 2023-07-07: qty 4, 28d supply, fill #3

## 2023-03-15 ENCOUNTER — Other Ambulatory Visit (HOSPITAL_COMMUNITY): Payer: Self-pay

## 2023-03-16 ENCOUNTER — Encounter: Payer: Self-pay | Admitting: Family Medicine

## 2023-03-16 ENCOUNTER — Other Ambulatory Visit (HOSPITAL_COMMUNITY): Payer: Self-pay

## 2023-03-16 NOTE — Telephone Encounter (Signed)
LVM for patient to c/b and schedule.  

## 2023-03-17 ENCOUNTER — Other Ambulatory Visit (HOSPITAL_COMMUNITY): Payer: Self-pay

## 2023-03-17 ENCOUNTER — Other Ambulatory Visit (INDEPENDENT_AMBULATORY_CARE_PROVIDER_SITE_OTHER): Payer: BC Managed Care – PPO

## 2023-03-17 DIAGNOSIS — E039 Hypothyroidism, unspecified: Secondary | ICD-10-CM | POA: Diagnosis not present

## 2023-03-17 LAB — TSH: TSH: 2.5 u[IU]/mL (ref 0.35–5.50)

## 2023-03-20 ENCOUNTER — Other Ambulatory Visit (HOSPITAL_COMMUNITY): Payer: Self-pay

## 2023-03-20 ENCOUNTER — Other Ambulatory Visit: Payer: Self-pay | Admitting: Family Medicine

## 2023-03-20 DIAGNOSIS — F988 Other specified behavioral and emotional disorders with onset usually occurring in childhood and adolescence: Secondary | ICD-10-CM

## 2023-03-20 MED ORDER — LISDEXAMFETAMINE DIMESYLATE 50 MG PO CAPS
50.0000 mg | ORAL_CAPSULE | Freq: Every day | ORAL | 0 refills | Status: DC
  Filled 2023-03-20: qty 30, 30d supply, fill #0

## 2023-03-20 NOTE — Telephone Encounter (Signed)
 ERx

## 2023-04-13 ENCOUNTER — Other Ambulatory Visit (HOSPITAL_COMMUNITY): Payer: Self-pay

## 2023-04-13 ENCOUNTER — Other Ambulatory Visit: Payer: Self-pay

## 2023-04-14 ENCOUNTER — Other Ambulatory Visit (HOSPITAL_COMMUNITY): Payer: Self-pay

## 2023-04-19 ENCOUNTER — Other Ambulatory Visit: Payer: Self-pay | Admitting: Family Medicine

## 2023-04-19 DIAGNOSIS — F988 Other specified behavioral and emotional disorders with onset usually occurring in childhood and adolescence: Secondary | ICD-10-CM

## 2023-04-20 NOTE — Telephone Encounter (Signed)
Name of Medication:  Vyvanse Name of Pharmacy:  [facility] Last Fill or Written Date and Quantity:  03/24/23, #30 Last Office Visit and Type:  08/04/22, CPE Next Office Visit and Type:  none Last Controlled Substance Agreement Date:  02/09/17 Last UDS:  04/07/18

## 2023-04-21 ENCOUNTER — Other Ambulatory Visit (HOSPITAL_COMMUNITY): Payer: Self-pay

## 2023-04-21 MED ORDER — LISDEXAMFETAMINE DIMESYLATE 50 MG PO CAPS
50.0000 mg | ORAL_CAPSULE | Freq: Every day | ORAL | 0 refills | Status: DC
  Filled 2023-04-21: qty 30, 30d supply, fill #0

## 2023-04-21 NOTE — Telephone Encounter (Signed)
 ERx

## 2023-05-08 ENCOUNTER — Other Ambulatory Visit: Payer: Self-pay

## 2023-05-08 ENCOUNTER — Other Ambulatory Visit (HOSPITAL_COMMUNITY): Payer: Self-pay

## 2023-05-11 ENCOUNTER — Other Ambulatory Visit (HOSPITAL_COMMUNITY): Payer: Self-pay

## 2023-05-18 ENCOUNTER — Other Ambulatory Visit: Payer: Self-pay | Admitting: Family Medicine

## 2023-05-18 ENCOUNTER — Other Ambulatory Visit: Payer: Self-pay

## 2023-05-18 DIAGNOSIS — F988 Other specified behavioral and emotional disorders with onset usually occurring in childhood and adolescence: Secondary | ICD-10-CM

## 2023-05-19 NOTE — Telephone Encounter (Signed)
 Name of Medication:  Vyvanse Name of Pharmacy:  [facility] Last Fill or Written Date and Quantity:  04/21/23, #30 Last Office Visit and Type:  08/04/22, CPE Next Office Visit and Type:  none Last Controlled Substance Agreement Date:  02/09/17 Last UDS:  04/07/18

## 2023-05-21 ENCOUNTER — Encounter (HOSPITAL_COMMUNITY): Payer: Self-pay

## 2023-05-21 ENCOUNTER — Other Ambulatory Visit (HOSPITAL_COMMUNITY): Payer: Self-pay

## 2023-05-25 ENCOUNTER — Other Ambulatory Visit (HOSPITAL_COMMUNITY): Payer: Self-pay

## 2023-05-25 MED ORDER — BUPRENORPHINE 15 MCG/HR TD PTWK
1.0000 | MEDICATED_PATCH | TRANSDERMAL | 3 refills | Status: AC
  Filled 2023-05-25 – 2023-06-05 (×2): qty 4, 28d supply, fill #0
  Filled 2023-08-05: qty 4, 28d supply, fill #1
  Filled 2023-11-01: qty 4, 28d supply, fill #2

## 2023-06-05 ENCOUNTER — Other Ambulatory Visit (HOSPITAL_COMMUNITY): Payer: Self-pay

## 2023-06-08 ENCOUNTER — Other Ambulatory Visit (HOSPITAL_COMMUNITY): Payer: Self-pay

## 2023-06-19 ENCOUNTER — Other Ambulatory Visit (HOSPITAL_COMMUNITY): Payer: Self-pay

## 2023-06-19 ENCOUNTER — Other Ambulatory Visit: Payer: Self-pay

## 2023-06-22 ENCOUNTER — Other Ambulatory Visit: Payer: Self-pay | Admitting: Family Medicine

## 2023-06-22 DIAGNOSIS — E039 Hypothyroidism, unspecified: Secondary | ICD-10-CM

## 2023-06-22 NOTE — Telephone Encounter (Signed)
 E-scribed refill  Plz schedule CPE and fasting labs (no food/drink- except water and/or blk coffee 5 hrs prior) for additional refills.

## 2023-06-23 NOTE — Telephone Encounter (Signed)
 Noted.

## 2023-06-23 NOTE — Telephone Encounter (Signed)
 lvm for pt to call office to schedule appt.

## 2023-06-23 NOTE — Telephone Encounter (Signed)
 Patient has been scheduled

## 2023-07-08 ENCOUNTER — Other Ambulatory Visit (HOSPITAL_COMMUNITY): Payer: Self-pay

## 2023-07-09 ENCOUNTER — Telehealth: Admitting: Nurse Practitioner

## 2023-07-09 DIAGNOSIS — J020 Streptococcal pharyngitis: Secondary | ICD-10-CM | POA: Diagnosis not present

## 2023-07-09 MED ORDER — AMOXICILLIN 500 MG PO CAPS: 500.0000 mg | ORAL_CAPSULE | Freq: Two times a day (BID) | ORAL | 0 refills | Status: AC

## 2023-07-09 NOTE — Progress Notes (Signed)
 E-Visit for Sore Throat - Strep Symptoms  We are sorry that you are not feeling well.  Here is how we plan to help!  Based on what you have shared with me it is likely that you have strep pharyngitis.  Strep pharyngitis is inflammation and infection in the back of the throat.  This is an infection cause by bacteria and is treated with antibiotics.  I have prescribed Amoxicillin  500 mg twice a day for 10 days. For throat pain, we recommend over the counter oral pain relief medications such as acetaminophen or aspirin , or anti-inflammatory medications such as ibuprofen or naproxen sodium. Topical treatments such as oral throat lozenges or sprays may be used as needed. Strep infections are not as easily transmitted as other respiratory infections, however we still recommend that you avoid close contact with loved ones, especially the very young and elderly.  Remember to wash your hands thoroughly throughout the day as this is the number one way to prevent the spread of infection and wipe down door knobs and counters with disinfectant.   Home Care: Only take medications as instructed by your medical team. Complete the entire course of an antibiotic. Do not take these medications with alcohol. A steam or ultrasonic humidifier can help congestion.  You can place a towel over your head and breathe in the steam from hot water coming from a faucet. Avoid close contacts especially the very young and the elderly. Cover your mouth when you cough or sneeze. Always remember to wash your hands.  Get Help Right Away If: You develop worsening fever or sinus pain. You develop a severe head ache or visual changes. Your symptoms persist after you have completed your treatment plan.  Make sure you Understand these instructions. Will watch your condition. Will get help right away if you are not doing well or get worse.   Thank you for choosing an e-visit.  Your e-visit answers were reviewed by a board  certified advanced clinical practitioner to complete your personal care plan. Depending upon the condition, your plan could have included both over the counter or prescription medications.  Please review your pharmacy choice. Make sure the pharmacy is open so you can pick up prescription now. If there is a problem, you may contact your provider through [employer] and have the prescription routed to another pharmacy.  Your safety is important to us . If you have drug allergies check your prescription carefully.   For the next 24 hours you can use MyChart to ask questions about today's visit, request a non-urgent call back, or ask for a work or school excuse. You will get an email in the next two days asking about your experience. I hope that your e-visit has been valuable and will speed your recovery.   I spent approximately 5 minutes reviewing the patient's history, current symptoms and coordinating their care today.

## 2023-07-18 ENCOUNTER — Other Ambulatory Visit: Payer: Self-pay | Admitting: Family Medicine

## 2023-07-18 DIAGNOSIS — F988 Other specified behavioral and emotional disorders with onset usually occurring in childhood and adolescence: Secondary | ICD-10-CM

## 2023-07-20 NOTE — Telephone Encounter (Signed)
 Name of Medication:  Vyvanse  Name of Pharmacy:  [facility] Last Fill or Written Date and Quantity:  04/21/23, #30 Last Office Visit and Type:  08/04/22, CPE Next Office Visit and Type:  09/15/23, CPE Last Controlled Substance Agreement Date:  02/09/17 Last UDS:  04/07/18

## 2023-07-22 ENCOUNTER — Encounter: Payer: Self-pay | Admitting: Family Medicine

## 2023-07-22 ENCOUNTER — Other Ambulatory Visit (HOSPITAL_COMMUNITY): Payer: Self-pay

## 2023-07-22 ENCOUNTER — Ambulatory Visit: Admitting: Family Medicine

## 2023-07-22 VITALS — BP 130/78 | HR 87 | Temp 98.1°F | Ht 71.5 in | Wt 229.0 lb

## 2023-07-22 DIAGNOSIS — J019 Acute sinusitis, unspecified: Secondary | ICD-10-CM | POA: Insufficient documentation

## 2023-07-22 MED ORDER — AMOXICILLIN-POT CLAVULANATE 875-125 MG PO TABS: 1.0000 | ORAL_TABLET | Freq: Two times a day (BID) | ORAL | 0 refills | Status: AC

## 2023-07-22 MED ORDER — LISDEXAMFETAMINE DIMESYLATE 50 MG PO CAPS
50.0000 mg | ORAL_CAPSULE | Freq: Every day | ORAL | 0 refills | Status: DC
  Filled 2023-07-22: qty 30, 30d supply, fill #0

## 2023-07-22 NOTE — Telephone Encounter (Signed)
 ERx

## 2023-07-22 NOTE — Progress Notes (Signed)
 Ph: [phone] Fax: [phone]   Patient ID: Chase Burke, male    DOB: 1965-11-06, 58 y.o.   MRN: [id number]  This visit was conducted in person.  BP 130/78   Pulse 87   Temp 98.1 F (36.7 C) (Oral)   Ht 5' 11.5" (1.816 m)   Wt 229 lb (103.9 kg)   SpO2 99%   BMI 31.49 kg/m    CC: ongoing cough  Subjective:   HPI: Chase Burke is a 58 y.o. male presenting on 07/22/2023 for Cough (Ongoing cough; been taking amoxicillin  prescribed, finished 10day course on Sunday; feeling better but yesterday and today feeling like congestion is moving up into ears and down into lungs; dry cough; ears feel clogged like they want to pop but not popping, slight tightness in chest.)   Seen virtually 07/09/2023 for ST congestion with rhinorrhea and cough, dx possible strep throat treated with amoxicillin  500mg  BID 10d course, completed 4d ago. He had severe throat pain with fatigue. After treatment, felt some better but in the past 1-2 days started to have worsening both head and chest congestion with ear pain. Dry cough persists, ears feel stopped up, some chest tightness.  Symptoms ongoing for 2 weeks.  Initial COVID test negative  H/o childhood asthma - doesn't have albuterol inhaler.  Wife recently sick - she's feeling better.   He is also taking cough drops and OTC pseudophed, benadryl, flonase .  Stressful time - in middle of exams at work     Relevant past medical, surgical, family and social history reviewed and updated as indicated. Interim medical history since our last visit reviewed. Allergies and medications reviewed and updated. Outpatient Medications Prior to Visit  Medication Sig Dispense Refill   aspirin  EC 81 MG tablet Take 1 tablet (81 mg total) by mouth every other day.     atorvastatin  (LIPITOR) 40 MG tablet TAKE 1 TABLET (40 MG TOTAL) BY MOUTH DAILY. 90 tablet 2   buprenorphine  (BUTRANS ) 15 MCG/HR Place 1 patch onto the skin once a week. 4 patch 3   buprenorphine  (BUTRANS )  15 MCG/HR Place 1 patch onto the skin once a week. 4 patch 3   cetirizine (ZYRTEC) 10 MG tablet Take 10 mg by mouth daily.     diphenhydrAMINE (BENADRYL) 25 MG tablet Take 25-50 mg by mouth at bedtime as needed for sleep.      fluticasone  (FLONASE ) 50 MCG/ACT nasal spray Place 2 sprays into both nostrils daily. (Patient taking differently: Place 2 sprays into both nostrils daily. As needed) 16 g 6   Glucos-MSM-C-Mn-Ginger-Willow (GLUCOSAMINE MSM COMPLEX PO) Take 1 tablet by mouth daily.     levothyroxine  (SYNTHROID ) 150 MCG tablet TAKE 1 TABLET BY MOUTH DAILY BEFORE BREAKFAST. 90 tablet 0   lisdexamfetamine (VYVANSE ) 50 MG capsule Take 1 capsule (50 mg total) by mouth daily. 30 capsule 0   lisinopril  (ZESTRIL ) 10 MG tablet Take 1 tablet (10 mg total) by mouth daily. 90 tablet 4   Melatonin 3 MG TABS Take 3 mg by mouth at bedtime.     Multiple Vitamin (MULTIVITAMIN) tablet Take 1 tablet by mouth daily.     naproxen (NAPROSYN) 250 MG tablet Take 250 mg by mouth 2 (two) times daily with a meal. Takes 220 mg     cyclobenzaprine  (FLEXERIL ) 5 MG tablet Take 1 tablet (5 mg total) by mouth 3 (three) times daily as needed. 90 tablet 2   lisdexamfetamine (VYVANSE ) 50 MG capsule Take 1 capsule (50 mg total) by  mouth daily. 30 capsule 0   buprenorphine  (BUTRANS ) 15 MCG/HR Place 1 patch onto the skin once a week. 4 patch 3   buprenorphine  (BUTRANS ) 15 MCG/HR Place 1 patch onto the skin once a week. 4 patch 3   cyclobenzaprine  (FLEXERIL ) 5 MG tablet Take 1 tablet (5 mg total) by mouth 3 (three) times daily as needed. 90 tablet 2   cyclobenzaprine  (FLEXERIL ) 5 MG tablet Take 1 tablet (5 mg total) by mouth 3 (three) times daily as needed. 90 tablet 2   lisdexamfetamine (VYVANSE ) 50 MG capsule Take 1 capsule (50 mg total) by mouth daily. 30 capsule 0   polyethylene glycol powder (GLYCOLAX/MIRALAX) 17 GM/SCOOP powder Take 1 Container by mouth once.     No facility-administered medications prior to visit.      Per HPI unless specifically indicated in ROS section below Review of Systems  Objective:  BP 130/78   Pulse 87   Temp 98.1 F (36.7 C) (Oral)   Ht 5' 11.5" (1.816 m)   Wt 229 lb (103.9 kg)   SpO2 99%   BMI 31.49 kg/m   Wt Readings from Last 3 Encounters:  07/22/23 229 lb (103.9 kg)  10/10/22 220 lb (99.8 kg)  09/15/22 220 lb (99.8 kg)      Physical Exam Vitals and nursing note reviewed.  Constitutional:      Appearance: Normal appearance. He is not ill-appearing.  HENT:     Head: Normocephalic and atraumatic.     Right Ear: Hearing, tympanic membrane, ear canal and external ear normal. There is no impacted cerumen.     Left Ear: Hearing, tympanic membrane, ear canal and external ear normal. There is no impacted cerumen.     Nose: Nose normal. No mucosal edema, congestion or rhinorrhea.     Right Turbinates: Pale. Not enlarged or swollen.     Left Turbinates: Pale. Not enlarged or swollen.     Right Sinus: No maxillary sinus tenderness or frontal sinus tenderness.     Left Sinus: No maxillary sinus tenderness or frontal sinus tenderness.     Mouth/Throat:     Mouth: Mucous membranes are moist.     Pharynx: Oropharynx is clear. No oropharyngeal exudate or posterior oropharyngeal erythema.  Eyes:     Extraocular Movements: Extraocular movements intact.     Conjunctiva/sclera: Conjunctivae normal.     Pupils: Pupils are equal, round, and reactive to light.  Cardiovascular:     Rate and Rhythm: Normal rate and regular rhythm.     Pulses: Normal pulses.     Heart sounds: Normal heart sounds. No murmur heard. Pulmonary:     Effort: Pulmonary effort is normal. No respiratory distress.     Breath sounds: Normal breath sounds. No wheezing, rhonchi or rales.  Musculoskeletal:     Cervical back: Normal range of motion and neck supple. No rigidity.     Right lower leg: No edema.     Left lower leg: No edema.  Lymphadenopathy:     Cervical: No cervical adenopathy.  Skin:     General: Skin is warm and dry.     Findings: No rash.  Neurological:     Mental Status: He is alert.  Psychiatric:        Mood and Affect: Mood normal.        Behavior: Behavior normal.        Assessment & Plan:   Problem List Items Addressed This Visit     Acute sinusitis - Primary  Ongoing symptoms after initial treatment with amoxicillin  500mg  bid course.  Suspect developing into sinus infection. Given duration and progression of symptoms, will broaden antibiotic coverage to augmentin 10d course.  Further supportive measures reviewed, including treatment for his allergic rhinitis. Update if not improving with treatment.      Relevant Medications   amoxicillin -clavulanate (AUGMENTIN) 875-125 MG tablet     Meds ordered this encounter  Medications   amoxicillin -clavulanate (AUGMENTIN) 875-125 MG tablet    Sig: Take 1 tablet by mouth 2 (two) times daily for 10 days.    Dispense:  20 tablet    Refill:  0    No orders of the defined types were placed in this encounter.   Patient Instructions  You may be developing a sinus infection. Take medicine as prescribed: augmentin 10 day course.  Albuterol inhaler as needed as well.  Push fluids and plenty of rest. Nasal saline irrigation or neti pot to help drain sinuses. May use plain mucinex with plenty of fluid to help mobilize mucous. Please let us  know if fever >101.5, trouble opening/closing mouth, difficulty swallowing, or worsening instead of improving as expected.   Follow up plan: Return if symptoms worsen or fail to improve.  Claire Crick, MD

## 2023-07-22 NOTE — Assessment & Plan Note (Signed)
 Ongoing symptoms after initial treatment with amoxicillin  500mg  bid course.  Suspect developing into sinus infection. Given duration and progression of symptoms, will broaden antibiotic coverage to augmentin 10d course.  Further supportive measures reviewed, including treatment for his allergic rhinitis. Update if not improving with treatment.

## 2023-07-22 NOTE — Patient Instructions (Addendum)
 You may be developing a sinus infection. Take medicine as prescribed: augmentin 10 day course.  Albuterol inhaler as needed as well.  Push fluids and plenty of rest. Nasal saline irrigation or neti pot to help drain sinuses. May use plain mucinex with plenty of fluid to help mobilize mucous. Please let us  know if fever >101.5, trouble opening/closing mouth, difficulty swallowing, or worsening instead of improving as expected.

## 2023-07-23 ENCOUNTER — Other Ambulatory Visit: Payer: Self-pay | Admitting: Family Medicine

## 2023-07-23 DIAGNOSIS — F988 Other specified behavioral and emotional disorders with onset usually occurring in childhood and adolescence: Secondary | ICD-10-CM

## 2023-07-24 NOTE — Telephone Encounter (Signed)
 Dr Vallarie Gauze, will you address this in Dr Crissie Dome absence?  Name of Medication:  Vyvanse  Name of Pharmacy:  [facility] Last Fill or Written Date and Quantity:  05/21/23, #30 Last Office Visit and Type:  08/04/22, CPE Next Office Visit and Type:  09/15/23, CPE Last Controlled Substance Agreement Date:  02/09/17 Last UDS:  04/07/18

## 2023-07-26 ENCOUNTER — Other Ambulatory Visit (HOSPITAL_COMMUNITY): Payer: Self-pay

## 2023-07-26 MED ORDER — LISDEXAMFETAMINE DIMESYLATE 50 MG PO CAPS
50.0000 mg | ORAL_CAPSULE | Freq: Every day | ORAL | 0 refills | Status: DC
  Filled 2023-07-26: qty 30, 30d supply, fill #0

## 2023-07-26 NOTE — Telephone Encounter (Signed)
 Sent. Thanks.

## 2023-07-27 ENCOUNTER — Other Ambulatory Visit (HOSPITAL_COMMUNITY): Payer: Self-pay

## 2023-07-30 NOTE — Telephone Encounter (Signed)
 Appreciate Dr Vallarie Gauze sending this in for me.

## 2023-08-05 ENCOUNTER — Other Ambulatory Visit: Payer: Self-pay

## 2023-08-05 ENCOUNTER — Other Ambulatory Visit (HOSPITAL_COMMUNITY): Payer: Self-pay

## 2023-08-06 ENCOUNTER — Encounter: Payer: Self-pay | Admitting: Family Medicine

## 2023-08-07 MED ORDER — DOXYCYCLINE HYCLATE 100 MG PO TABS: 100.0000 mg | ORAL_TABLET | Freq: Two times a day (BID) | ORAL | 0 refills | Status: DC

## 2023-08-23 ENCOUNTER — Other Ambulatory Visit: Payer: Self-pay | Admitting: Family Medicine

## 2023-08-23 DIAGNOSIS — F988 Other specified behavioral and emotional disorders with onset usually occurring in childhood and adolescence: Secondary | ICD-10-CM

## 2023-08-24 ENCOUNTER — Other Ambulatory Visit (HOSPITAL_COMMUNITY): Payer: Self-pay

## 2023-08-24 MED ORDER — CYCLOBENZAPRINE HCL 5 MG PO TABS
5.0000 mg | ORAL_TABLET | Freq: Three times a day (TID) | ORAL | 2 refills | Status: AC | PRN
  Filled 2023-08-24: qty 90, 30d supply, fill #0

## 2023-08-24 MED ORDER — BUPRENORPHINE 15 MCG/HR TD PTWK
1.0000 | MEDICATED_PATCH | TRANSDERMAL | 3 refills | Status: AC
  Filled 2023-08-24 – 2023-08-31 (×2): qty 4, 28d supply, fill #0
  Filled 2023-10-06: qty 4, 28d supply, fill #1
  Filled 2023-11-30: qty 4, 28d supply, fill #2

## 2023-08-24 NOTE — Telephone Encounter (Signed)
 Name of Medication:  Vyvanse  Name of Pharmacy:  [facility] Last Fill or Written Date and Quantity:  05/21/23, #30 Last Office Visit and Type:  07/27/22, CPE Next Office Visit and Type:  09/15/23, CPE Last Controlled Substance Agreement Date:  02/09/17 Last UDS:  04/07/18

## 2023-08-26 ENCOUNTER — Other Ambulatory Visit (HOSPITAL_COMMUNITY): Payer: Self-pay

## 2023-08-26 MED ORDER — LISDEXAMFETAMINE DIMESYLATE 50 MG PO CAPS
50.0000 mg | ORAL_CAPSULE | Freq: Every day | ORAL | 0 refills | Status: DC
  Filled 2023-08-26: qty 30, 30d supply, fill #0

## 2023-08-26 NOTE — Telephone Encounter (Signed)
 ERx

## 2023-09-01 ENCOUNTER — Other Ambulatory Visit (HOSPITAL_COMMUNITY): Payer: Self-pay

## 2023-09-02 ENCOUNTER — Ambulatory Visit: Admitting: Family Medicine

## 2023-09-06 ENCOUNTER — Other Ambulatory Visit: Payer: Self-pay | Admitting: Family Medicine

## 2023-09-06 DIAGNOSIS — E039 Hypothyroidism, unspecified: Secondary | ICD-10-CM

## 2023-09-06 DIAGNOSIS — E785 Hyperlipidemia, unspecified: Secondary | ICD-10-CM

## 2023-09-06 DIAGNOSIS — Z125 Encounter for screening for malignant neoplasm of prostate: Secondary | ICD-10-CM

## 2023-09-08 ENCOUNTER — Other Ambulatory Visit (INDEPENDENT_AMBULATORY_CARE_PROVIDER_SITE_OTHER): Payer: Self-pay

## 2023-09-08 DIAGNOSIS — Z125 Encounter for screening for malignant neoplasm of prostate: Secondary | ICD-10-CM

## 2023-09-08 DIAGNOSIS — E039 Hypothyroidism, unspecified: Secondary | ICD-10-CM | POA: Diagnosis not present

## 2023-09-08 DIAGNOSIS — E785 Hyperlipidemia, unspecified: Secondary | ICD-10-CM | POA: Diagnosis not present

## 2023-09-08 LAB — LIPID PANEL
Cholesterol: 176 mg/dL (ref 0–200)
HDL: 44.2 mg/dL (ref >39.00)
LDL Cholesterol: 105 mg/dL — ABNORMAL HIGH (ref 0–99)
NonHDL: 131.71
Total CHOL/HDL Ratio: 4
Triglycerides: 135 mg/dL (ref 0.0–149.0)
VLDL: 27 mg/dL (ref 0.0–40.0)

## 2023-09-08 LAB — COMPREHENSIVE METABOLIC PANEL WITH GFR
ALT: 24 U/L (ref 0–53)
AST: 22 U/L (ref 0–37)
Albumin: 4.3 g/dL (ref 3.5–5.2)
Alkaline Phosphatase: 51 U/L (ref 39–117)
BUN: 22 mg/dL (ref 6–23)
CO2: 32 meq/L (ref 19–32)
Calcium: 9.4 mg/dL (ref 8.4–10.5)
Chloride: 102 meq/L (ref 96–112)
Creatinine, Ser: 0.86 mg/dL (ref 0.40–1.50)
GFR: 97.29 mL/min (ref >60.00)
Glucose, Bld: 105 mg/dL — ABNORMAL HIGH (ref 70–99)
Potassium: 4.9 meq/L (ref 3.5–5.1)
Sodium: 137 meq/L (ref 135–145)
Total Bilirubin: 0.4 mg/dL (ref 0.2–1.2)
Total Protein: 6.8 g/dL (ref 6.0–8.3)

## 2023-09-08 LAB — PSA: PSA: 1.28 ng/mL (ref 0.10–4.00)

## 2023-09-08 LAB — TSH: TSH: 1.41 u[IU]/mL (ref 0.35–5.50)

## 2023-09-09 ENCOUNTER — Ambulatory Visit: Payer: Self-pay | Admitting: Family Medicine

## 2023-09-10 ENCOUNTER — Ambulatory Visit
Admission: RE | Admit: 2023-09-10 | Discharge: 2023-09-10 | Disposition: A | Discharge status: Home / Self Care | Source: Ambulatory Visit | Attending: Emergency Medicine | Admitting: Emergency Medicine

## 2023-09-10 VITALS — BP 128/74 | HR 98 | Temp 97.9°F | Resp 20

## 2023-09-10 DIAGNOSIS — R051 Acute cough: Secondary | ICD-10-CM | POA: Diagnosis not present

## 2023-09-10 DIAGNOSIS — J069 Acute upper respiratory infection, unspecified: Secondary | ICD-10-CM

## 2023-09-10 MED ORDER — CIPROFLOXACIN HCL 500 MG PO TABS: 500.0000 mg | ORAL_TABLET | Freq: Two times a day (BID) | ORAL | 0 refills | Status: DC

## 2023-09-10 MED ORDER — PREDNISONE 10 MG (21) PO TBPK: ORAL_TABLET | Freq: Every day | ORAL | 0 refills | Status: DC

## 2023-09-10 MED ORDER — BENZONATATE 100 MG PO CAPS: 100.0000 mg | ORAL_CAPSULE | Freq: Three times a day (TID) | ORAL | 0 refills | Status: DC

## 2023-09-10 MED ORDER — GUAIFENESIN-CODEINE 100-10 MG/5ML PO SOLN: 5.0000 mL | Freq: Every evening | ORAL | 0 refills | Status: DC | PRN

## 2023-09-10 NOTE — ED Provider Notes (Signed)
 Chase Burke    CSN: [id number] Arrival date & time: 09/10/23  9060      History   Chief Complaint Chief Complaint  Patient presents with   URI    I've been fighting the same infection for over three months. Have been on amoxicillin , Augmentin , and doxycycline . Each has temporarily made symptoms better, but it keeps coming back. - Entered by patient    HPI Chase Burke is a 58 y.o. male.   Patient presents for evaluation of a productive cough with yellow to green sputum, sore throat, intermittent bilateral ear fullness, intermittent bilateral eye erythema and drainage, centralized chest tightness and chest congestion present for 2 to 3 months.  Experiencing wheezing intermittently with cough only.  Fluctuating in intensity, has flared over the last 2 weeks after completion of doxycycline .  Has additionally taken amoxicillin  and Augmentin , Flonase , cough drops and increase rest.  While symptoms do improve do not fully resolve.  Denies shortness of breath.  History of childhood asthma, former smoker.  Denies headache, sinus pressure.  Tolerating food and liquids.  Past Medical History:  Diagnosis Date   ADD (attention deficit disorder with hyperactivity)    Allergy    Anxiety    Asthma    Chronic pain    COVID-19 virus infection 10/30/2020   DDD (degenerative disc disease), cervical    sees Dr Oneil Ellen   GERD (gastroesophageal reflux disease)    Hyperlipidemia    Hypertension    Hypothyroidism    Neuromuscular disorder [facility])     Patient Active Problem List   Diagnosis Date Noted   Acute sinusitis 07/22/2023   Injury of left Achilles tendon 08/04/2022   Obesity, Class I, BMI 30.0-34.9 (see actual BMI) 02/05/2016   Attention deficit disorder without hyperactivity 08/21/2015   Chronic pain syndrome 08/09/2014   Health maintenance examination 01/12/2014   Essential hypertension, benign 06/27/2013   Hypothyroidism 09/09/2011   Cervical post-laminectomy syndrome  07/28/2011   Arthropathy of cervical facet joint 05/15/2011   Allergic rhinitis 07/24/2009   Hyperlipidemia 07/23/2007   SHOULDER PAIN, RIGHT, CHRONIC 07/23/2007    Past Surgical History:  Procedure Laterality Date   BREAST SURGERY     reduction   CERVICAL DISC SURGERY  07/2007   C4/5 and C5/6 ([facility])   COLONOSCOPY  01/2019   5 TAs, rpt 3 yrs [facility])    SHOULDER SURGERY Right 2000   Supple   SHOULDER SURGERY Left 2008   Supple (torn labrum)   TONSILLECTOMY         Home Medications    Prior to Admission medications   Medication Sig Start Date End Date Taking? Authorizing Provider  benzonatate  (TESSALON ) 100 MG capsule Take 1 capsule (100 mg total) by mouth every 8 (eight) hours. 09/10/23  Yes Monike Bragdon R, NP  ciprofloxacin (CIPRO) 500 MG tablet Take 1 tablet (500 mg total) by mouth 2 (two) times daily for 5 days. 09/10/23 09/15/23 Yes Ambre Kobayashi R, NP  guaiFENesin-codeine 100-10 MG/5ML syrup Take 5 mLs by mouth at bedtime as needed for cough. 09/10/23  Yes Zaide Kardell R, NP  predniSONE (STERAPRED UNI-PAK 21 TAB) 10 MG (21) TBPK tablet Take by mouth daily. Take 6 tabs by mouth daily  for 1 days, then 5 tabs for 1 days, then 4 tabs for 1 days, then 3 tabs for 1 days, 2 tabs for 1 days, then 1 tab by mouth daily for 1 days 09/10/23  Yes Teresa Shelba SAUNDERS, NP  aspirin  EC  81 MG tablet Take 1 tablet (81 mg total) by mouth every other day. 07/29/21   Rilla Baller, MD  atorvastatin  (LIPITOR) 40 MG tablet TAKE 1 TABLET (40 MG TOTAL) BY MOUTH DAILY. 10/22/22   Rilla Baller, MD  buprenorphine  (BUTRANS ) 15 MCG/HR Place 1 patch onto the skin once a week. 12/01/22     buprenorphine  (BUTRANS ) 15 MCG/HR Place 1 patch onto the skin once a week. 05/25/23     buprenorphine  (BUTRANS ) 15 MCG/HR Place 1 patch onto the skin once a week. 08/24/23     cetirizine (ZYRTEC) 10 MG tablet Take 10 mg by mouth daily.    [provider]  cyclobenzaprine  (FLEXERIL ) 5 MG tablet Take 1  tablet (5 mg total) by mouth 3 (three) times daily as needed. 08/24/23     diphenhydrAMINE (BENADRYL) 25 MG tablet Take 25-50 mg by mouth at bedtime as needed for sleep.     [provider]  doxycycline  (VIBRA -TABS) 100 MG tablet Take 1 tablet (100 mg total) by mouth 2 (two) times daily. 08/07/23   Rilla Baller, MD  fluticasone  (FLONASE ) 50 MCG/ACT nasal spray Place 2 sprays into both nostrils daily. Patient taking differently: Place 2 sprays into both nostrils daily. As needed 01/12/14   Krystal Reyes LABOR, MD  Glucos-MSM-C-Mn-Ginger-Willow (GLUCOSAMINE MSM COMPLEX PO) Take 1 tablet by mouth daily.    [provider]  levothyroxine  (SYNTHROID ) 150 MCG tablet TAKE 1 TABLET BY MOUTH DAILY BEFORE BREAKFAST. 06/22/23   Rilla Baller, MD  lisdexamfetamine (VYVANSE ) 50 MG capsule Take 1 capsule (50 mg total) by mouth daily. 08/26/23   Rilla Baller, MD  lisinopril  (ZESTRIL ) 10 MG tablet Take 1 tablet (10 mg total) by mouth daily. 08/04/22   Rilla Baller, MD  Melatonin 3 MG TABS Take 3 mg by mouth at bedtime.    [provider]  Multiple Vitamin (MULTIVITAMIN) tablet Take 1 tablet by mouth daily.    [provider]  naproxen (NAPROSYN) 250 MG tablet Take 250 mg by mouth 2 (two) times daily with a meal. Takes 220 mg    [provider]    Family History Family History  Problem Relation Age of Onset   Cancer Father 18       prostate   Stroke Father 30       several   Diabetes Paternal Grandmother    Heart disease Paternal Grandfather        young age, unknown details   Arthritis Other        fhx   Hyperlipidemia Other        strong fmhx   Hypertension Other        strong fmhx   Colon cancer Neg Hx    Esophageal cancer Neg Hx    Rectal cancer Neg Hx    Stomach cancer Neg Hx     Social History Social History   Tobacco Use   Smoking status: Former    Current packs/day: 0.00    Average packs/day: 0.5 packs/day for 33.0 years (16.5 ttl  pk-yrs)    Types: Cigarettes    Start date: 03/17/1984    Quit date: 03/18/2007    Years since quitting: 16.4   Smokeless tobacco: Never  Vaping Use   Vaping status: Never Used  Substance Use Topics   Alcohol use: Yes    Alcohol/week: 0.0 standard drinks of alcohol    Comment: 1 drink daily   Drug use: No     Allergies   Ibuprofen and  Influenza vaccines   Review of Systems Review of Systems   Physical Exam Triage Vital Signs ED Triage Vitals  Encounter Vitals Group     BP 09/10/23 0954 128/74     Girls Systolic BP Percentile --      Girls Diastolic BP Percentile --      Boys Systolic BP Percentile --      Boys Diastolic BP Percentile --      Pulse Rate 09/10/23 0954 98     Resp 09/10/23 0954 20     Temp 09/10/23 0954 97.9 F (36.6 C)     Temp Source 09/10/23 0954 Oral     SpO2 09/10/23 0954 98 %     Weight --      Height --      Head Circumference --      Peak Flow --      Pain Score 09/10/23 0951 1     Pain Loc --      Pain Education --      Exclude from Growth Chart --    No data found.  Updated Vital Signs BP 128/74 (BP Location: Left Arm)   Pulse 98   Temp 97.9 F (36.6 C) (Oral)   Resp 20   SpO2 98%   Visual Acuity Right Eye Distance:   Left Eye Distance:   Bilateral Distance:    Right Eye Near:   Left Eye Near:    Bilateral Near:     Physical Exam Constitutional:      Appearance: Normal appearance.  HENT:     Head: Normocephalic.     Right Ear: Tympanic membrane, ear canal and external ear normal.     Left Ear: Tympanic membrane, ear canal and external ear normal.     Nose: Congestion present.     Right Sinus: No maxillary sinus tenderness or frontal sinus tenderness.     Left Sinus: No maxillary sinus tenderness or frontal sinus tenderness.     Mouth/Throat:     Pharynx: Oropharynx is clear. No oropharyngeal exudate.     Comments: Mild erythema to the oropharynx without exudate  Cardiovascular:     Rate and Rhythm: Regular rhythm.  Tachycardia present.     Pulses: Normal pulses.     Heart sounds: Normal heart sounds.  Pulmonary:     Effort: Pulmonary effort is normal.     Breath sounds: Normal breath sounds.   Musculoskeletal:     Cervical back: Normal range of motion and neck supple.   Neurological:     Mental Status: He is alert and oriented to person, place, and time. Mental status is at baseline.      UC Treatments / Results  Labs (all labs ordered are listed, but only abnormal results are displayed) Labs Reviewed - No data to display  EKG   Radiology No results found.  Procedures Procedures (including critical care time)  Medications Ordered in UC Medications - No data to display  Initial Impression / Assessment and Plan / UC Course  I have reviewed the triage vital signs and the nursing notes.  Pertinent labs & imaging results that were available during my care of the patient were reviewed by me and considered in my medical decision making (see chart for details).  Acute URI, acute cough  Patient is in no signs of distress nor toxic appearing.  Vital signs are stable.  Low suspicion for pneumonia, pneumothorax or bronchitis and therefore will defer imaging.  Cough persisting for 2  to 3 months, offered chest x-ray, declined.  Prescribed ciprofloxacin, prednisone, Tessalon  and guaifenesin codeine, PDMP reviewed.May use additional over-the-counter medications as needed for supportive care.  May follow-up with urgent care as needed if symptoms persist or worsen.  Final Clinical Impressions(s) / UC Diagnoses   Final diagnoses:  Acute URI  Acute cough     Discharge Instructions      Begin ciprofloxacin twice daily for 5 days for bacterial coverage  Begin prednisone every morning with food as directed to open and relax the airway, should help with chest tightness and wheezing  You may use Tessalon  every 8 hours as needed for cough, may use cough syrup at bedtime to allow for rest    You  can take Tylenol  as needed for fever reduction and pain relief.   For cough: honey 1/2 to 1 teaspoon (you can dilute the honey in water or another fluid).  You can also use guaifenesin and dextromethorphan for cough. You can use a humidifier for chest congestion and cough.  If you don't have a humidifier, you can sit in the bathroom with the hot shower running.      For sore throat: try warm salt water gargles, cepacol lozenges, throat spray, warm tea or water with lemon/honey, popsicles or ice, or OTC cold relief medicine for throat discomfort.   For congestion: take a daily anti-histamine like Zyrtec, Claritin, and a oral decongestant, such as pseudoephedrine.  You can also use Flonase  1-2 sprays in each nostril daily.   It is important to stay hydrated: drink plenty of fluids (water, gatorade/powerade/pedialyte, juices, or teas) to keep your throat moisturized and help further relieve irritation/discomfort.    ED Prescriptions     Medication Sig Dispense Auth. Provider   ciprofloxacin (CIPRO) 500 MG tablet Take 1 tablet (500 mg total) by mouth 2 (two) times daily for 5 days. 10 tablet Bubba Vanbenschoten R, NP   predniSONE (STERAPRED UNI-PAK 21 TAB) 10 MG (21) TBPK tablet Take by mouth daily. Take 6 tabs by mouth daily  for 1 days, then 5 tabs for 1 days, then 4 tabs for 1 days, then 3 tabs for 1 days, 2 tabs for 1 days, then 1 tab by mouth daily for 1 days 21 tablet Fleming Prill R, NP   benzonatate  (TESSALON ) 100 MG capsule Take 1 capsule (100 mg total) by mouth every 8 (eight) hours. 21 capsule Nakiea Metzner R, NP   guaiFENesin-codeine 100-10 MG/5ML syrup Take 5 mLs by mouth at bedtime as needed for cough. 120 mL Wilmot Quevedo, Shelba SAUNDERS, NP      I have reviewed the PDMP during this encounter.   Teresa Shelba SAUNDERS, NP 09/10/23 1032

## 2023-09-10 NOTE — ED Triage Notes (Signed)
 Patient complains of URI infection 3 to 4 months. Cough with yellow/greenish sputum. Also reports sore throat. Took OTC cough medication last night. Rates pain 1/10.

## 2023-09-10 NOTE — Discharge Instructions (Signed)
 Begin ciprofloxacin twice daily for 5 days for bacterial coverage  Begin prednisone every morning with food as directed to open and relax the airway, should help with chest tightness and wheezing  You may use Tessalon  every 8 hours as needed for cough, may use cough syrup at bedtime to allow for rest    You can take Tylenol  as needed for fever reduction and pain relief.   For cough: honey 1/2 to 1 teaspoon (you can dilute the honey in water or another fluid).  You can also use guaifenesin and dextromethorphan for cough. You can use a humidifier for chest congestion and cough.  If you don't have a humidifier, you can sit in the bathroom with the hot shower running.      For sore throat: try warm salt water gargles, cepacol lozenges, throat spray, warm tea or water with lemon/honey, popsicles or ice, or OTC cold relief medicine for throat discomfort.   For congestion: take a daily anti-histamine like Zyrtec, Claritin, and a oral decongestant, such as pseudoephedrine.  You can also use Flonase  1-2 sprays in each nostril daily.   It is important to stay hydrated: drink plenty of fluids (water, gatorade/powerade/pedialyte, juices, or teas) to keep your throat moisturized and help further relieve irritation/discomfort.

## 2023-09-15 ENCOUNTER — Encounter: Payer: Self-pay | Admitting: Family Medicine

## 2023-09-15 ENCOUNTER — Other Ambulatory Visit (HOSPITAL_COMMUNITY): Payer: Self-pay

## 2023-09-15 ENCOUNTER — Ambulatory Visit (INDEPENDENT_AMBULATORY_CARE_PROVIDER_SITE_OTHER): Payer: Self-pay | Admitting: Family Medicine

## 2023-09-15 VITALS — BP 122/72 | HR 93 | Temp 98.0°F | Ht 71.0 in | Wt 222.0 lb

## 2023-09-15 DIAGNOSIS — E66811 Obesity, class 1: Secondary | ICD-10-CM

## 2023-09-15 DIAGNOSIS — F988 Other specified behavioral and emotional disorders with onset usually occurring in childhood and adolescence: Secondary | ICD-10-CM | POA: Diagnosis not present

## 2023-09-15 DIAGNOSIS — Z0000 Encounter for general adult medical examination without abnormal findings: Secondary | ICD-10-CM

## 2023-09-15 DIAGNOSIS — E785 Hyperlipidemia, unspecified: Secondary | ICD-10-CM | POA: Diagnosis not present

## 2023-09-15 DIAGNOSIS — J301 Allergic rhinitis due to pollen: Secondary | ICD-10-CM

## 2023-09-15 DIAGNOSIS — J0191 Acute recurrent sinusitis, unspecified: Secondary | ICD-10-CM

## 2023-09-15 DIAGNOSIS — G894 Chronic pain syndrome: Secondary | ICD-10-CM

## 2023-09-15 DIAGNOSIS — I10 Essential (primary) hypertension: Secondary | ICD-10-CM

## 2023-09-15 DIAGNOSIS — Z23 Encounter for immunization: Secondary | ICD-10-CM | POA: Diagnosis not present

## 2023-09-15 DIAGNOSIS — E039 Hypothyroidism, unspecified: Secondary | ICD-10-CM | POA: Diagnosis not present

## 2023-09-15 MED ORDER — LEVOTHYROXINE SODIUM 150 MCG PO TABS: 150.0000 ug | ORAL_TABLET | Freq: Every day | ORAL | 4 refills | Status: AC

## 2023-09-15 MED ORDER — ATORVASTATIN CALCIUM 40 MG PO TABS: 40.0000 mg | ORAL_TABLET | Freq: Every day | ORAL | 4 refills | Status: AC

## 2023-09-15 MED ORDER — LISINOPRIL 10 MG PO TABS: 10.0000 mg | ORAL_TABLET | Freq: Every day | ORAL | 4 refills | Status: AC

## 2023-09-15 MED ORDER — METHYLPHENIDATE HCL ER (OSM) 36 MG PO TBCR
36.0000 mg | EXTENDED_RELEASE_TABLET | Freq: Every day | ORAL | 0 refills | Status: DC
  Filled 2023-09-15 – 2023-09-25 (×4): qty 90, 90d supply, fill #0

## 2023-09-15 NOTE — Patient Instructions (Addendum)
 Tdap today.  Consider pneumococcal vaccine next visit.  Update me if recurrent sinus symptoms for ENT evaluation.  Good to see you today Return as needed or in 1 year for next physical.

## 2023-09-15 NOTE — Progress Notes (Unsigned)
 Ph: [phone] Fax: [phone]   Patient ID: Chase Burke, male    DOB: 1965/11/15, 58 y.o.   MRN: [id number]  This visit was conducted in person.  BP 122/72   Pulse 93   Temp 98 F (36.7 C) (Oral)   Ht 5' 11 (1.803 m)   Wt 222 lb (100.7 kg)   SpO2 99%   BMI 30.96 kg/m    CC: CPE Subjective:   HPI: Chase Burke is a 58 y.o. male presenting on 09/15/2023 for Annual Exam   Wife just had knee replacement.   Seen early 07/2023 with ongoing sinusitis symptoms despite treatment with amoxicillin  followed by augmentin  followed by doxycycline . Only received temporary relief after each antibiotic course.  Seen last week at [facility] with coughing fit, ST, chest tightness and ongoing sinus symptoms along with itchy watery eyes L>R - treated with ciprofloxacin , tessalon , codeine  cough syrup and prednisone  taper. He decided not to take prednisone  taper or codeine  cough syrup.  Today is last day of ciprofloxacin  - notes benefit from antibiotic.  Notes respiratory/sinus symptoms started 05/25/2023.  Ongoing symptoms despite regular flonase  and cetirizine use.   Pain management through Dr Orlando ([facility] ).   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 Stable period, effective. Previously on methylphenidate  (Concerta ) 36mg  daily however had trouble getting this filled by pharmacies due to med shortage. Tolerates stimulant well, without HA, insomnia, chest pain, anorexia. He feels Vyvanse  initially worked better but now has comparable effect to methylphenidate .    Hypothyroidism - continues levothyroxine  150mcg daily.   Preventative: Colonoscopy 09/2022 - HP x2, rpt 5 yrs ([facility])  Prostate cancer screening - fmhx (father dx age 18 s/p radiation). PSA yearly, DRE QOY.   Lung cancer screen - not eligible Flu shot - declines - h/o bad reaction with arm swelling to previous flu shot  COVID vaccine Moderna 05/2019, 06/2019, booster Pfizer 02/2020  Hep A series completed Tp 2004, Tdap 2014, update  today  Prevnar-20 - to consider  Zostavax 2014  Shingrix - 05/2020, 08/2020 Seat belt use discussed  Sunscreens use discussed, no changing moles on skin  Ex-smoker (2009) , no vaping Alcohol - 2 beers daily  Dentist q6 mo Eye exam - yearly  Bowel - no constipation   Lives with wife and son, no pets  Edu: PhD (Econ and Stats) - working on Event organiser in math  Occ: Economy Professor at [employer]  Activity: enjoys disc golf on weekends, 3d/wk  Diet: good water, fruits/vegetable daily      Relevant past medical, surgical, family and social history reviewed and updated as indicated. Interim medical history since our last visit reviewed. Allergies and medications reviewed and updated. Outpatient Medications Prior to Visit  Medication Sig Dispense Refill   aspirin  EC 81 MG tablet Take 1 tablet (81 mg total) by mouth every other day.     buprenorphine  (BUTRANS ) 15 MCG/HR Place 1 patch onto the skin once a week. 4 patch 3   buprenorphine  (BUTRANS ) 15 MCG/HR Place 1 patch onto the skin once a week. 4 patch 3   buprenorphine  (BUTRANS ) 15 MCG/HR Place 1 patch onto the skin once a week. 4 patch 3   cetirizine (ZYRTEC) 10 MG tablet Take 10 mg by mouth daily.     cyclobenzaprine  (FLEXERIL ) 5 MG tablet Take 1 tablet (5 mg total) by mouth 3 (three) times daily as needed. 90 tablet 2   diphenhydrAMINE (BENADRYL) 25 MG tablet Take 25-50 mg by mouth at bedtime as needed for  sleep.      fluticasone  (FLONASE ) 50 MCG/ACT nasal spray Place 2 sprays into both nostrils daily. (Patient taking differently: Place 2 sprays into both nostrils daily. As needed) 16 g 6   Glucos-MSM-C-Mn-Ginger-Willow (GLUCOSAMINE MSM COMPLEX PO) Take 1 tablet by mouth daily.     lisdexamfetamine (VYVANSE ) 50 MG capsule Take 1 capsule (50 mg total) by mouth daily. 30 capsule 0   Melatonin 3 MG TABS Take 3 mg by mouth at bedtime.     Multiple Vitamin (MULTIVITAMIN) tablet Take 1 tablet by mouth daily.     naproxen (NAPROSYN) 250 MG  tablet Take 250 mg by mouth 2 (two) times daily with a meal. Takes 220 mg     atorvastatin  (LIPITOR) 40 MG tablet TAKE 1 TABLET (40 MG TOTAL) BY MOUTH DAILY. 90 tablet 2   levothyroxine  (SYNTHROID ) 150 MCG tablet TAKE 1 TABLET BY MOUTH DAILY BEFORE BREAKFAST. 90 tablet 0   lisinopril  (ZESTRIL ) 10 MG tablet Take 1 tablet (10 mg total) by mouth daily. 90 tablet 4   predniSONE  (STERAPRED UNI-PAK 21 TAB) 10 MG (21) TBPK tablet Take by mouth daily. Take 6 tabs by mouth daily  for 1 days, then 5 tabs for 1 days, then 4 tabs for 1 days, then 3 tabs for 1 days, 2 tabs for 1 days, then 1 tab by mouth daily for 1 days 21 tablet 0   benzonatate  (TESSALON ) 100 MG capsule Take 1 capsule (100 mg total) by mouth every 8 (eight) hours. 21 capsule 0   ciprofloxacin  (CIPRO ) 500 MG tablet Take 1 tablet (500 mg total) by mouth 2 (two) times daily for 5 days. 10 tablet 0   doxycycline  (VIBRA -TABS) 100 MG tablet Take 1 tablet (100 mg total) by mouth 2 (two) times daily. 20 tablet 0   guaiFENesin -codeine  100-10 MG/5ML syrup Take 5 mLs by mouth at bedtime as needed for cough. (Patient not taking: Reported on 09/15/2023) 120 mL 0   No facility-administered medications prior to visit.     Per HPI unless specifically indicated in ROS section below Review of Systems  Constitutional:  Negative for activity change, appetite change, chills, fatigue, fever and unexpected weight change.  HENT:  Negative for hearing loss.   Eyes:  Negative for visual disturbance.  Respiratory:  Positive for chest tightness (occ- URI related). Negative for cough, shortness of breath and wheezing.   Cardiovascular:  Negative for chest pain, palpitations and leg swelling.  Gastrointestinal:  Negative for abdominal distention, abdominal pain, blood in stool, constipation, diarrhea, nausea and vomiting.  Genitourinary:  Negative for difficulty urinating and hematuria.  Musculoskeletal:  Negative for arthralgias, myalgias and neck pain.  Skin:   Negative for rash.  Neurological:  Negative for dizziness, seizures, syncope and headaches.  Hematological:  Negative for adenopathy. Does not bruise/bleed easily.  Psychiatric/Behavioral:  Negative for dysphoric mood. The patient is not nervous/anxious.     Objective:  BP 122/72   Pulse 93   Temp 98 F (36.7 C) (Oral)   Ht 5' 11 (1.803 m)   Wt 222 lb (100.7 kg)   SpO2 99%   BMI 30.96 kg/m   Wt Readings from Last 3 Encounters:  09/15/23 222 lb (100.7 kg)  07/22/23 229 lb (103.9 kg)  10/10/22 220 lb (99.8 kg)      Physical Exam Vitals and nursing note reviewed.  Constitutional:      General: He is not in acute distress.    Appearance: Normal appearance. He is well-developed. He is  not ill-appearing.  HENT:     Head: Normocephalic and atraumatic.     Right Ear: Hearing, tympanic membrane, ear canal and external ear normal.     Left Ear: Hearing, tympanic membrane, ear canal and external ear normal.     Nose: No mucosal edema, congestion or rhinorrhea.     Right Turbinates: Not enlarged, swollen or pale.     Left Turbinates: Not enlarged, swollen or pale.     Right Sinus: No maxillary sinus tenderness or frontal sinus tenderness.     Left Sinus: No maxillary sinus tenderness or frontal sinus tenderness.     Comments: Possible polyp to left nasal passage    Mouth/Throat:     Mouth: Mucous membranes are moist.     Pharynx: Oropharynx is clear. No oropharyngeal exudate or posterior oropharyngeal erythema.  Eyes:     General: No scleral icterus.    Extraocular Movements: Extraocular movements intact.     Conjunctiva/sclera: Conjunctivae normal.     Pupils: Pupils are equal, round, and reactive to light.  Neck:     Thyroid : No thyroid  mass or thyromegaly.  Cardiovascular:     Rate and Rhythm: Normal rate and regular rhythm.     Pulses: Normal pulses.          Radial pulses are 2+ on the right side and 2+ on the left side.     Heart sounds: Normal heart sounds. No murmur  heard. Pulmonary:     Effort: Pulmonary effort is normal. No respiratory distress.     Breath sounds: Normal breath sounds. No wheezing, rhonchi or rales.  Abdominal:     General: Bowel sounds are normal. There is no distension.     Palpations: Abdomen is soft. There is no mass.     Tenderness: There is no abdominal tenderness. There is no guarding or rebound.     Hernia: No hernia is present.  Musculoskeletal:        General: Normal range of motion.     Cervical back: Normal range of motion and neck supple.     Right lower leg: No edema.     Left lower leg: No edema.  Lymphadenopathy:     Cervical: No cervical adenopathy.  Skin:    General: Skin is warm and dry.     Findings: No rash.  Neurological:     General: No focal deficit present.     Mental Status: He is alert and oriented to person, place, and time.  Psychiatric:        Mood and Affect: Mood normal.        Behavior: Behavior normal.        Thought Content: Thought content normal.        Judgment: Judgment normal.       Results for orders placed or performed in visit on 09/08/23  PSA   Collection Time: 09/08/23  8:59 AM  Result Value Ref Range   PSA 1.28 0.10 - 4.00 ng/mL  TSH   Collection Time: 09/08/23  8:59 AM  Result Value Ref Range   TSH 1.41 0.35 - 5.50 uIU/mL  Comprehensive metabolic panel with GFR   Collection Time: 09/08/23  8:59 AM  Result Value Ref Range   Sodium 137 135 - 145 mEq/L   Potassium 4.9 3.5 - 5.1 mEq/L   Chloride 102 96 - 112 mEq/L   CO2 32 19 - 32 mEq/L   Glucose, Bld 105 (H) 70 - 99 mg/dL   BUN  22 6 - 23 mg/dL   Creatinine, Ser 9.13 0.40 - 1.50 mg/dL   Total Bilirubin 0.4 0.2 - 1.2 mg/dL   Alkaline Phosphatase 51 39 - 117 U/L   AST 22 0 - 37 U/L   ALT 24 0 - 53 U/L   Total Protein 6.8 6.0 - 8.3 g/dL   Albumin 4.3 3.5 - 5.2 g/dL   GFR 02.70 >39.99 mL/min   Calcium  9.4 8.4 - 10.5 mg/dL  Lipid panel   Collection Time: 09/08/23  8:59 AM  Result Value Ref Range   Cholesterol 176  0 - 200 mg/dL   Triglycerides 864.9 0.0 - 149.0 mg/dL   HDL 55.79 >60.99 mg/dL   VLDL 72.9 0.0 - 59.9 mg/dL   LDL Cholesterol 894 (H) 0 - 99 mg/dL   Total CHOL/HDL Ratio 4    NonHDL 131.71     Assessment & Plan:   Problem List Items Addressed This Visit     Health maintenance examination - Primary (Chronic)   Preventative protocols reviewed and updated unless pt declined. Discussed healthy diet and lifestyle.       Hyperlipidemia   Chronic, stable on atorvastatin  40mg  daily - continue.  The 10-year ASCVD risk score (Arnett DK, et al., 2019) is: 5.8%   Values used to calculate the score:     Age: 40 years     Clincally relevant sex: Male     Is Non-Hispanic African American: No     Diabetic: No     Tobacco smoker: No     Systolic Blood Pressure: 122 mmHg     Is BP treated: Yes     HDL Cholesterol: 44.2 mg/dL     Total Cholesterol: 176 mg/dL       Relevant Medications   atorvastatin  (LIPITOR) 40 MG tablet   lisinopril  (ZESTRIL ) 10 MG tablet   Allergic rhinitis   Chronic issue with nasal sinus congestion, rhinorrhea, allergic conjunctivitis symptoms - despite regular antihistamine, nasal steroid use.  Exam today with left sided nasal polyp - but doubt this is contributing to his symptoms. He just completed another antibiotic course (Cipro ). Did not take prednisone .  He is overall improving - advised to let us  know if recurrent symptoms for ENT evaluation.  Consider trial singulair .       Hypothyroidism   Chronic, stable. Continue current regimen.       Relevant Medications   levothyroxine  (SYNTHROID ) 150 MCG tablet   Essential hypertension, benign   Chronic, stable. Continue current regimen       Relevant Medications   atorvastatin  (LIPITOR) 40 MG tablet   lisinopril  (ZESTRIL ) 10 MG tablet   Chronic pain syndrome   Followed by pain management Dr Orlando on butran patch.       Attention deficit disorder without hyperactivity   Chronic, stable period on  stimulant  Desires to retry generic methylphenidate  CR 36mg  daily - refilled #90 to pharmacy for 3 month supply.  [place] CSRS reviewed.       Obesity, Class I, BMI 30.0-34.9 (see actual BMI)   Continue to encourage healthy diet and lifestyle choices to affect sustainable weight loss.       Acute sinusitis   See above Several episodes of recurrent sinusitis over the past 3 months.  Will refer to ENT if recurrent symptoms develop.       Other Visit Diagnoses       Need for Tdap vaccination       Relevant Orders   Tdap vaccine  greater than or equal to 7yo IM (Completed)        Meds ordered this encounter  Medications   atorvastatin  (LIPITOR) 40 MG tablet    Sig: Take 1 tablet (40 mg total) by mouth daily.    Dispense:  90 tablet    Refill:  4   levothyroxine  (SYNTHROID ) 150 MCG tablet    Sig: Take 1 tablet (150 mcg total) by mouth daily before breakfast.    Dispense:  90 tablet    Refill:  4   lisinopril  (ZESTRIL ) 10 MG tablet    Sig: Take 1 tablet (10 mg total) by mouth daily.    Dispense:  90 tablet    Refill:  4   methylphenidate  36 MG PO CR tablet    Sig: Take 1 tablet (36 mg total) by mouth daily.    Dispense:  90 tablet    Refill:  0    via secured chat Per dr. rilla fill 28 days from vyvanse  picked up on 6/11 JAK    Orders Placed This Encounter  Procedures   Tdap vaccine greater than or equal to 7yo IM    Patient Instructions  Tdap today.  Consider pneumococcal vaccine next visit.  Update me if recurrent sinus symptoms for ENT evaluation.  Good to see you today Return as needed or in 1 year for next physical.   Follow up plan: Return in about 6 months (around 03/17/2024), or if symptoms worsen or fail to improve.  Anton rilla, MD

## 2023-09-15 NOTE — Assessment & Plan Note (Signed)
 Preventative protocols reviewed and updated unless pt declined. Discussed healthy diet and lifestyle.

## 2023-09-15 NOTE — Assessment & Plan Note (Signed)
 Chronic, stable. Continue current regimen.

## 2023-09-17 NOTE — Assessment & Plan Note (Signed)
 Chronic, stable on atorvastatin  40mg  daily - continue.  The 10-year ASCVD risk score (Arnett DK, et al., 2019) is: 5.8%   Values used to calculate the score:     Age: 58 years     Clincally relevant sex: Male     Is Non-Hispanic African American: No     Diabetic: No     Tobacco smoker: No     Systolic Blood Pressure: 122 mmHg     Is BP treated: Yes     HDL Cholesterol: 44.2 mg/dL     Total Cholesterol: 176 mg/dL

## 2023-09-17 NOTE — Assessment & Plan Note (Signed)
 Continue to encourage healthy diet and lifestyle choices to affect sustainable weight loss.

## 2023-09-17 NOTE — Assessment & Plan Note (Signed)
 Chronic, stable. Continue current regimen.

## 2023-09-17 NOTE — Assessment & Plan Note (Addendum)
 Chronic, stable period on stimulant  Desires to retry generic methylphenidate  CR 36mg  daily - refilled #90 to pharmacy for 3 month supply.  [place] CSRS reviewed.

## 2023-09-17 NOTE — Assessment & Plan Note (Signed)
 Followed by pain management Dr Orlando on butran patch.

## 2023-09-17 NOTE — Assessment & Plan Note (Signed)
 See above Several episodes of recurrent sinusitis over the past 3 months.  Will refer to ENT if recurrent symptoms develop.

## 2023-09-17 NOTE — Assessment & Plan Note (Signed)
 Chronic issue with nasal sinus congestion, rhinorrhea, allergic conjunctivitis symptoms - despite regular antihistamine, nasal steroid use.  Exam today with left sided nasal polyp - but doubt this is contributing to his symptoms. He just completed another antibiotic course (Cipro ). Did not take prednisone .  He is overall improving - advised to let us  know if recurrent symptoms for ENT evaluation.  Consider trial singulair .

## 2023-09-23 ENCOUNTER — Other Ambulatory Visit (HOSPITAL_COMMUNITY): Payer: Self-pay

## 2023-09-24 ENCOUNTER — Other Ambulatory Visit (HOSPITAL_COMMUNITY): Payer: Self-pay

## 2023-09-27 ENCOUNTER — Other Ambulatory Visit (HOSPITAL_COMMUNITY): Payer: Self-pay

## 2023-10-06 ENCOUNTER — Other Ambulatory Visit (HOSPITAL_COMMUNITY): Payer: Self-pay

## 2023-10-09 ENCOUNTER — Other Ambulatory Visit (HOSPITAL_COMMUNITY): Payer: Self-pay

## 2023-11-02 ENCOUNTER — Other Ambulatory Visit (HOSPITAL_COMMUNITY): Payer: Self-pay

## 2023-11-23 ENCOUNTER — Other Ambulatory Visit (HOSPITAL_COMMUNITY): Payer: Self-pay

## 2023-11-23 MED ORDER — CYCLOBENZAPRINE HCL 5 MG PO TABS
5.0000 mg | ORAL_TABLET | Freq: Three times a day (TID) | ORAL | 2 refills | Status: AC | PRN
  Filled 2023-11-23: qty 90, 30d supply, fill #0
  Filled 2024-04-06: qty 90, 30d supply, fill #1

## 2023-11-23 MED ORDER — BUPRENORPHINE 15 MCG/HR TD PTWK
1.0000 | MEDICATED_PATCH | TRANSDERMAL | 3 refills | Status: AC
  Filled 2024-01-03: qty 4, 28d supply, fill #0
  Filled 2024-02-05: qty 4, 28d supply, fill #1

## 2023-12-01 ENCOUNTER — Other Ambulatory Visit (HOSPITAL_COMMUNITY): Payer: Self-pay

## 2023-12-20 ENCOUNTER — Other Ambulatory Visit: Payer: Self-pay | Admitting: Family Medicine

## 2023-12-20 DIAGNOSIS — F988 Other specified behavioral and emotional disorders with onset usually occurring in childhood and adolescence: Secondary | ICD-10-CM

## 2023-12-21 NOTE — Telephone Encounter (Signed)
 Name of Medication:  Methylphenidate  Name of Pharmacy:  [facility] Last Fill or Written Date and Quantity:  09/28/23, #90 Last Office Visit and Type:  09/15/23, CPE Next Office Visit and Type:  09/19/24, CPE Last Controlled Substance Agreement Date:  02/09/17 Last UDS:  04/07/18

## 2023-12-23 ENCOUNTER — Other Ambulatory Visit (HOSPITAL_COMMUNITY): Payer: Self-pay

## 2023-12-23 MED ORDER — METHYLPHENIDATE HCL ER (OSM) 36 MG PO TBCR
36.0000 mg | EXTENDED_RELEASE_TABLET | Freq: Every day | ORAL | 0 refills | Status: DC
  Filled 2023-12-23 – 2023-12-25 (×2): qty 90, 90d supply, fill #0

## 2023-12-23 NOTE — Telephone Encounter (Signed)
 ERx

## 2023-12-25 ENCOUNTER — Other Ambulatory Visit (HOSPITAL_COMMUNITY): Payer: Self-pay

## 2024-01-04 ENCOUNTER — Other Ambulatory Visit (HOSPITAL_COMMUNITY): Payer: Self-pay

## 2024-02-08 ENCOUNTER — Other Ambulatory Visit (HOSPITAL_COMMUNITY): Payer: Self-pay

## 2024-02-22 ENCOUNTER — Other Ambulatory Visit (HOSPITAL_COMMUNITY): Payer: Self-pay

## 2024-02-22 MED ORDER — MELOXICAM 15 MG PO TABS
15.0000 mg | ORAL_TABLET | Freq: Every day | ORAL | 2 refills | Status: AC
  Filled 2024-02-22: qty 30, 30d supply, fill #0
  Filled 2024-03-18: qty 30, 30d supply, fill #1

## 2024-02-22 MED ORDER — BUPRENORPHINE 15 MCG/HR TD PTWK
1.0000 | MEDICATED_PATCH | TRANSDERMAL | 3 refills | Status: AC
  Filled 2024-03-08: qty 4, 28d supply, fill #0
  Filled 2024-04-06: qty 4, 28d supply, fill #1

## 2024-03-09 ENCOUNTER — Other Ambulatory Visit (HOSPITAL_COMMUNITY): Payer: Self-pay

## 2024-03-19 ENCOUNTER — Other Ambulatory Visit: Payer: Self-pay | Admitting: Family Medicine

## 2024-03-19 DIAGNOSIS — F988 Other specified behavioral and emotional disorders with onset usually occurring in childhood and adolescence: Secondary | ICD-10-CM

## 2024-03-21 ENCOUNTER — Encounter (HOSPITAL_COMMUNITY): Payer: Self-pay

## 2024-03-21 ENCOUNTER — Other Ambulatory Visit (HOSPITAL_COMMUNITY): Payer: Self-pay

## 2024-03-21 MED ORDER — METHYLPHENIDATE HCL ER (OSM) 36 MG PO TBCR
36.0000 mg | EXTENDED_RELEASE_TABLET | Freq: Every day | ORAL | 0 refills | Status: AC
  Filled 2024-03-21 – 2024-03-22 (×2): qty 90, 90d supply, fill #0

## 2024-03-21 NOTE — Telephone Encounter (Signed)
 ERx

## 2024-03-22 ENCOUNTER — Other Ambulatory Visit (HOSPITAL_COMMUNITY): Payer: Self-pay

## 2024-04-06 ENCOUNTER — Other Ambulatory Visit: Payer: Self-pay

## 2024-04-06 ENCOUNTER — Other Ambulatory Visit (HOSPITAL_COMMUNITY): Payer: Self-pay

## 2024-04-06 MED ORDER — MELOXICAM 15 MG PO TABS
15.0000 mg | ORAL_TABLET | Freq: Every day | ORAL | 2 refills | Status: AC
  Filled 2024-04-11: qty 90, 90d supply, fill #0

## 2024-04-11 ENCOUNTER — Other Ambulatory Visit (HOSPITAL_COMMUNITY): Payer: Self-pay

## 2024-09-12 ENCOUNTER — Other Ambulatory Visit

## 2024-09-19 ENCOUNTER — Encounter: Admitting: Family Medicine
